# Patient Record
Sex: Male | Born: 1950 | Race: Black or African American | Hispanic: No | Smoking: Current every day smoker
Health system: Southern US, Community
[De-identification: ages and names within clinical notes are randomized; demographics above are authoritative.]

## PROBLEM LIST (undated history)

## (undated) DIAGNOSIS — K5730 Diverticulosis of large intestine without perforation or abscess without bleeding: Secondary | ICD-10-CM

## (undated) DIAGNOSIS — Z860101 Personal history of adenomatous and serrated colon polyps: Secondary | ICD-10-CM

## (undated) DIAGNOSIS — C61 Malignant neoplasm of prostate: Secondary | ICD-10-CM

## (undated) DIAGNOSIS — Z86010 Personal history of colonic polyps: Secondary | ICD-10-CM

## (undated) DIAGNOSIS — J449 Chronic obstructive pulmonary disease, unspecified: Secondary | ICD-10-CM

## (undated) DIAGNOSIS — Z973 Presence of spectacles and contact lenses: Secondary | ICD-10-CM

## (undated) DIAGNOSIS — J410 Simple chronic bronchitis: Secondary | ICD-10-CM

## (undated) DIAGNOSIS — I10 Essential (primary) hypertension: Secondary | ICD-10-CM

## (undated) DIAGNOSIS — K08109 Complete loss of teeth, unspecified cause, unspecified class: Secondary | ICD-10-CM

## (undated) HISTORY — DX: Essential (primary) hypertension: I10

## (undated) HISTORY — PX: COLONOSCOPY WITH PROPOFOL: SHX5780

## (undated) HISTORY — PX: PROSTATE BIOPSY: SHX241

---

## 2007-06-24 ENCOUNTER — Ambulatory Visit: Admission: RE | Admit: 2007-06-24 | Discharge: 2007-06-24 | Payer: Self-pay | Admitting: Family Medicine

## 2007-11-30 ENCOUNTER — Ambulatory Visit: Payer: Self-pay | Admitting: Vascular Surgery

## 2007-11-30 ENCOUNTER — Encounter (INDEPENDENT_AMBULATORY_CARE_PROVIDER_SITE_OTHER): Payer: Self-pay | Admitting: Family Medicine

## 2007-11-30 ENCOUNTER — Ambulatory Visit (HOSPITAL_COMMUNITY): Admission: RE | Admit: 2007-11-30 | Discharge: 2007-11-30 | Payer: Self-pay | Admitting: Family Medicine

## 2015-11-03 ENCOUNTER — Other Ambulatory Visit: Payer: Self-pay | Admitting: Family Medicine

## 2015-11-03 ENCOUNTER — Ambulatory Visit
Admission: RE | Admit: 2015-11-03 | Discharge: 2015-11-03 | Disposition: A | Discharge status: Home / Self Care | Payer: BC Managed Care – PPO | Source: Ambulatory Visit | Attending: Family Medicine | Admitting: Family Medicine

## 2015-11-03 DIAGNOSIS — R059 Cough, unspecified: Secondary | ICD-10-CM

## 2015-11-03 DIAGNOSIS — R05 Cough: Secondary | ICD-10-CM

## 2016-03-14 DIAGNOSIS — M79673 Pain in unspecified foot: Secondary | ICD-10-CM

## 2016-06-10 ENCOUNTER — Other Ambulatory Visit: Payer: Self-pay | Admitting: Family Medicine

## 2016-06-10 ENCOUNTER — Ambulatory Visit
Admission: RE | Admit: 2016-06-10 | Discharge: 2016-06-10 | Disposition: A | Discharge status: Home / Self Care | Payer: Medicare Other | Source: Ambulatory Visit | Attending: Family Medicine | Admitting: Family Medicine

## 2016-06-10 DIAGNOSIS — M25511 Pain in right shoulder: Secondary | ICD-10-CM

## 2016-12-11 ENCOUNTER — Other Ambulatory Visit: Payer: Self-pay | Admitting: Family Medicine

## 2016-12-11 ENCOUNTER — Ambulatory Visit
Admission: RE | Admit: 2016-12-11 | Discharge: 2016-12-11 | Disposition: A | Discharge status: Home / Self Care | Payer: Medicare Other | Source: Ambulatory Visit | Attending: Family Medicine | Admitting: Family Medicine

## 2016-12-11 DIAGNOSIS — R05 Cough: Secondary | ICD-10-CM

## 2016-12-11 DIAGNOSIS — R059 Cough, unspecified: Secondary | ICD-10-CM

## 2016-12-12 ENCOUNTER — Other Ambulatory Visit: Payer: Self-pay | Admitting: Family Medicine

## 2016-12-12 ENCOUNTER — Encounter: Payer: Self-pay | Admitting: Gastroenterology

## 2016-12-12 DIAGNOSIS — Z136 Encounter for screening for cardiovascular disorders: Secondary | ICD-10-CM

## 2016-12-23 ENCOUNTER — Ambulatory Visit
Admission: RE | Admit: 2016-12-23 | Discharge: 2016-12-23 | Disposition: A | Discharge status: Home / Self Care | Payer: Medicare Other | Source: Ambulatory Visit | Attending: Family Medicine | Admitting: Family Medicine

## 2016-12-23 DIAGNOSIS — Z136 Encounter for screening for cardiovascular disorders: Secondary | ICD-10-CM

## 2017-02-05 ENCOUNTER — Encounter: Payer: Self-pay | Admitting: Gastroenterology

## 2017-02-05 ENCOUNTER — Ambulatory Visit (AMBULATORY_SURGERY_CENTER): Payer: Self-pay | Admitting: *Deleted

## 2017-02-05 VITALS — Ht 69.0 in | Wt 219.8 lb

## 2017-02-05 DIAGNOSIS — Z1211 Encounter for screening for malignant neoplasm of colon: Secondary | ICD-10-CM

## 2017-02-05 MED ORDER — NA SULFATE-K SULFATE-MG SULF 17.5-3.13-1.6 GM/177ML PO SOLN: ORAL | 0 refills | Status: DC

## 2017-02-05 NOTE — Progress Notes (Signed)
No allergies to eggs or soy. No prior anesthesia.  Pt given Emmi instructions for colonoscopy  No oxygen use  No diet drug use  

## 2017-02-19 ENCOUNTER — Encounter: Payer: Self-pay | Admitting: Gastroenterology

## 2017-02-19 ENCOUNTER — Ambulatory Visit (AMBULATORY_SURGERY_CENTER): Payer: Medicare Other | Admitting: Gastroenterology

## 2017-02-19 VITALS — BP 133/82 | HR 67 | Temp 98.4°F | Resp 25 | Ht 69.0 in | Wt 219.0 lb

## 2017-02-19 DIAGNOSIS — D127 Benign neoplasm of rectosigmoid junction: Secondary | ICD-10-CM | POA: Diagnosis not present

## 2017-02-19 DIAGNOSIS — D123 Benign neoplasm of transverse colon: Secondary | ICD-10-CM | POA: Diagnosis not present

## 2017-02-19 DIAGNOSIS — Z1212 Encounter for screening for malignant neoplasm of rectum: Secondary | ICD-10-CM | POA: Diagnosis not present

## 2017-02-19 DIAGNOSIS — Z1211 Encounter for screening for malignant neoplasm of colon: Secondary | ICD-10-CM

## 2017-02-19 DIAGNOSIS — D124 Benign neoplasm of descending colon: Secondary | ICD-10-CM | POA: Diagnosis not present

## 2017-02-19 DIAGNOSIS — D120 Benign neoplasm of cecum: Secondary | ICD-10-CM | POA: Diagnosis not present

## 2017-02-19 DIAGNOSIS — D122 Benign neoplasm of ascending colon: Secondary | ICD-10-CM | POA: Diagnosis not present

## 2017-02-19 HISTORY — PX: COLONOSCOPY: SHX174

## 2017-02-19 MED ORDER — SODIUM CHLORIDE 0.9 % IV SOLN: 500.0000 mL | INTRAVENOUS | Status: DC

## 2017-02-19 NOTE — Patient Instructions (Signed)
YOU HAD AN ENDOSCOPIC PROCEDURE TODAY AT [facility]:   Refer to the procedure report that was given to you for any specific questions about what was found during the examination.  If the procedure report does not answer your questions, please call your gastroenterologist to clarify.  If you requested that your care partner not be given the details of your procedure findings, then the procedure report has been included in a sealed envelope for you to review at your convenience later.  YOU SHOULD EXPECT: Some feelings of bloating in the abdomen. Passage of more gas than usual.  Walking can help get rid of the air that was put into your GI tract during the procedure and reduce the bloating. If you had a lower endoscopy (such as a colonoscopy or flexible sigmoidoscopy) you may notice spotting of blood in your stool or on the toilet paper. If you underwent a bowel prep for your procedure, you may not have a normal bowel movement for a few days.  Please Note:  You might notice some irritation and congestion in your nose or some drainage.  This is from the oxygen used during your procedure.  There is no need for concern and it should clear up in a day or so.  SYMPTOMS TO REPORT IMMEDIATELY:   Following lower endoscopy (colonoscopy or flexible sigmoidoscopy):  Excessive amounts of blood in the stool  Significant tenderness or worsening of abdominal pains  Swelling of the abdomen that is new, acute  Fever of 100F or higher   Following upper endoscopy (EGD)  Vomiting of blood or coffee ground material  New chest pain or pain under the shoulder blades  Painful or persistently difficult swallowing  New shortness of breath  Fever of 100F or higher  Black, tarry-looking stools  For urgent or emergent issues, a gastroenterologist can be reached at any hour by calling [phone].   DIET:  We do recommend a small meal at first, but then you may proceed to your regular diet.  Drink  plenty of fluids but you should avoid alcoholic beverages for 24 hours.  ACTIVITY:  You should plan to take it easy for the rest of today and you should NOT DRIVE or use heavy machinery until tomorrow (because of the sedation medicines used during the test).    FOLLOW UP: Our staff will call the number listed on your records the next business day following your procedure to check on you and address any questions or concerns that you may have regarding the information given to you following your procedure. If we do not reach you, we will leave a message.  However, if you are feeling well and you are not experiencing any problems, there is no need to return our call.  We will assume that you have returned to your regular daily activities without incident.  If any biopsies were taken you will be contacted by phone or by letter within the next 1-3 weeks.  Please call us at [phone] if you have not heard about the biopsies in 3 weeks.    SIGNATURES/CONFIDENTIALITY: You and/or your care partner have signed paperwork which will be entered into your electronic medical record.  These signatures attest to the fact that that the information above on your After Visit Summary has been reviewed and is understood.  Full responsibility of the confidentiality of this discharge information lies with you and/or your care-partner.  Polyp, diverticulosis information given,   Ibuprofen, naproxen or other non -  steroidal anti inflammatory medications for 2 weeks.

## 2017-02-19 NOTE — Progress Notes (Signed)
Pt's states no medical or surgical changes since previsit or office visit. 

## 2017-02-19 NOTE — Progress Notes (Signed)
ALERT AND ORIENTED TIMES 3. PT PLEASED WITH MAC. REPORT TO Opal Sidles RN

## 2017-02-19 NOTE — Op Note (Addendum)
[facility] Patient Name: Alan Knapp Procedure Date: 02/19/2017 11:21 AM MRN: [id number] Endoscopist: Remo Lipps P. Jemma Rasp MD, MD Age: 66 Referring MD:  Date of Birth: 1951/05/07 Gender: Male Account #: [id number] Procedure:                Colonoscopy Indications:              Screening for colorectal malignant neoplasm, This                            is the patient's first colonoscopy Medicines:                Monitored Anesthesia Care Procedure:                Pre-Anesthesia Assessment:                           - Prior to the procedure, a History and Physical                            was performed, and patient medications and                            allergies were reviewed. The patient's tolerance of                            previous anesthesia was also reviewed. The risks                            and benefits of the procedure and the sedation                            options and risks were discussed with the patient.                            All questions were answered, and informed consent                            was obtained. Prior Anticoagulants: The patient has                            taken aspirin, last dose was 1 day prior to                            procedure. ASA Grade Assessment: II - A patient                            with mild systemic disease. After reviewing the                            risks and benefits, the patient was deemed in                            satisfactory condition to undergo the procedure.  After obtaining informed consent, the colonoscope                            was passed under direct vision. Throughout the                            procedure, the patient's blood pressure, pulse, and                            oxygen saturations were monitored continuously. The                            Colonoscope was introduced through the anus and                            advanced to the the  cecum, identified by                            appendiceal orifice and ileocecal valve. The                            colonoscopy was performed without difficulty. The                            patient tolerated the procedure well. The quality                            of the bowel preparation was good. The ileocecal                            valve, appendiceal orifice, and rectum were                            photographed. Scope In: 11:26:27 AM Scope Out: 11:55:56 AM Scope Withdrawal Time: 0 hours 25 minutes 29 seconds  Total Procedure Duration: 0 hours 29 minutes 29 seconds  Findings:                 The perianal and digital rectal examinations were                            normal.                           Two sessile polyps were found in the cecum. The                            polyps were 3 to 4 mm in size. These polyps were                            removed with a cold snare. Resection and retrieval                            were complete.  Two sessile polyps were found in the ascending                            colon. The polyps were 3 to 5 mm in size. These                            polyps were removed with a cold snare. Resection                            and retrieval were complete.                           A 7 mm polyp was found in the hepatic flexure. The                            polyp was sessile. The polyp was removed with a                            cold snare. Resection and retrieval were complete.                           Three sessile polyps were found in the descending                            colon. The polyps were 3 to 6 mm in size. These                            polyps were removed with a cold snare. Resection                            and retrieval were complete.                           A 5 mm polyp was found in the recto-sigmoid colon.                            The polyp was flat. The polyp was removed with a                             cold snare. Resection and retrieval were complete.                           Scattered medium-mouthed diverticula were found in                            the left colon and right colon.                           Anal papilla(e) were hypertrophied.                           The exam was otherwise normal throughout the  examined colon. Of note, there was a network outage                            during the procedure and retroflexed views, while                            obtained, photos were not stored. Complications:            No immediate complications. Estimated blood loss:                            Minimal. Estimated Blood Loss:     Estimated blood loss was minimal. Impression:               - Two 3 to 4 mm polyps in the cecum, removed with a                            cold snare. Resected and retrieved.                           - Two 3 to 5 mm polyps in the ascending colon,                            removed with a cold snare. Resected and retrieved.                           - One 7 mm polyp at the hepatic flexure, removed                            with a cold snare. Resected and retrieved.                           - Three 3 to 6 mm polyps in the descending colon,                            removed with a cold snare. Resected and retrieved.                           - One 5 mm polyp at the recto-sigmoid colon,                            removed with a cold snare. Resected and retrieved.                           - Diverticulosis in the left colon and in the right                            colon.                           - Anal papilla(e) were hypertrophied.                           - Retroflexed  views obtained but not stored due to                            network outage. Recommendation:           - Patient has a contact number available for                            emergencies. The signs and symptoms of potential                             delayed complications were discussed with the                            patient. Return to normal activities tomorrow.                            Written discharge instructions were provided to the                            patient.                           - Resume previous diet.                           - Continue present medications.                           - Await pathology results.                           - Repeat colonoscopy is recommended for                            surveillance. The colonoscopy date will be                            determined after pathology results from today's                            exam become available for review.                           - No ibuprofen, naproxen, or other non-steroidal                            anti-inflammatory drugs for 2 weeks after polyp                            removal. Remo Lipps P. Aaron Boeh MD, MD 02/19/2017 12:07:28 PM This report has been signed electronically.

## 2017-02-19 NOTE — Progress Notes (Signed)
Called to room to assist during endoscopic procedure.  Patient ID and intended procedure confirmed with present staff. Received instructions for my participation in the procedure from the performing physician.  

## 2017-02-20 ENCOUNTER — Telehealth: Payer: Self-pay

## 2017-02-20 NOTE — Telephone Encounter (Signed)
  Follow up Call-  Call back number 02/19/2017  Post procedure Call Back phone  # [phone]  Permission to leave phone message Yes  Some recent data might be hidden     Patient questions:  Do you have a fever, pain , or abdominal swelling? No. Pain Score  0 *  Have you tolerated food without any problems? Yes.    Have you been able to return to your normal activities? Yes.    Do you have any questions about your discharge instructions: Diet   No. Medications  No. Follow up visit  No.  Do you have questions or concerns about your Care? No.  Actions: * If pain score is 4 or above: No action needed, pain <4.

## 2017-02-24 ENCOUNTER — Encounter: Payer: Self-pay | Admitting: Gastroenterology

## 2019-01-26 ENCOUNTER — Ambulatory Visit
Admission: RE | Admit: 2019-01-26 | Discharge: 2019-01-26 | Disposition: A | Discharge status: Home / Self Care | Payer: Medicare Other | Source: Ambulatory Visit | Attending: Radiation Oncology | Admitting: Radiation Oncology

## 2019-01-26 ENCOUNTER — Encounter: Payer: Self-pay | Admitting: Radiation Oncology

## 2019-01-26 ENCOUNTER — Other Ambulatory Visit: Payer: Self-pay

## 2019-01-26 VITALS — Ht 69.0 in | Wt 225.0 lb

## 2019-01-26 DIAGNOSIS — C61 Malignant neoplasm of prostate: Secondary | ICD-10-CM

## 2019-01-26 HISTORY — DX: Malignant neoplasm of prostate: C61

## 2019-01-26 NOTE — Progress Notes (Signed)
See progress note under physician encounter. 

## 2019-01-26 NOTE — Progress Notes (Signed)
Radiation Oncology         [phone] ________________________________  Initial Outpatient Consultation - Conducted via WebEx due to current COVID-19 concerns for limiting patient exposure  Name: Alan Knapp MRN: [id number]  Date: 01/26/2019  DOB: March 08, 1951  YH:[id number], [name], MD  Franchot Gallo, MD   REFERRING PHYSICIAN: Franchot Gallo, MD  DIAGNOSIS: 68 y.o. gentleman with Stage T2a adenocarcinoma of the prostate with Gleason score of 3+4 and PSA of 4.05.    ICD-10-CM   1. Malignant neoplasm of prostate ([place]) C61     HISTORY OF PRESENT ILLNESS: Alan Knapp is a 68 y.o. male with a diagnosis of prostate cancer. He was initially noted to have an elevated PSA of 4.82 in April 2018 by his primary care physician, Dr. Antony Contras.  Accordingly, he was referred for evaluation in urology by Dr. Diona Fanti on 02/03/2017, and re-check PSA at that time lowered to 3.29. Subsequent PSA's were 4.63 in August 2019 and 4.05, most recently in February 2020. He returned for follow-up with Dr. Diona Fanti on 10/23/2018, where a digital rectal examination was performed at that time revealing a new 5 mm prostate nodule in the right mid gland.  The patient proceeded to transrectal ultrasound with 12 biopsies of the prostate on 12/16/2018.  The prostate volume measured 23.53 cc.  Out of 12 core biopsies, 6 were positive.  The maximum Gleason score was 3+4, and this was seen in the right base lateral and right mid lateral. Additionally, there was Gleason 3+3 disease seen in the left mid lateral, left base, right base, and right mid.  Biopsies of prostate revealed:    The patient reviewed the biopsy results with his urologist and he has kindly been referred today for discussion of potential radiation treatment options.   PREVIOUS RADIATION THERAPY: No  PAST MEDICAL HISTORY:  Past Medical History:  Diagnosis Date  . Hypertension   . Prostate cancer ([place])       PAST SURGICAL HISTORY: Past Surgical  History:  Procedure Laterality Date  . PROSTATE BIOPSY      FAMILY HISTORY:  Family History  Problem Relation Age of Onset  . Pancreatic cancer Father   . Pancreatic cancer Brother   . Colon cancer Neg Hx   . Prostate cancer Neg Hx   . Breast cancer Neg Hx     SOCIAL HISTORY:  Social History   Socioeconomic History  . Marital status: Divorced    Spouse name: Not on file  . Number of children: 2  . Years of education: Not on file  . Highest education level: Not on file  Occupational History    Comment: retired  Scientific laboratory technician  . Financial resource strain: Not on file  . Food insecurity:    Worry: Not on file    Inability: Not on file  . Transportation needs:    Medical: Not on file    Non-medical: Not on file  Tobacco Use  . Smoking status: Current Some Day Smoker    Types: Cigars  . Smokeless tobacco: Never Used  Substance and Sexual Activity  . Alcohol use: No  . Drug use: No  . Sexual activity: Not on file  Lifestyle  . Physical activity:    Days per week: Not on file    Minutes per session: Not on file  . Stress: Not on file  Relationships  . Social connections:    Talks on phone: Not on file    Gets together: Not on file  Attends religious service: Not on file    Active member of club or organization: Not on file    Attends meetings of clubs or organizations: Not on file    Relationship status: Not on file  . Intimate partner violence:    Fear of current or ex partner: Not on file    Emotionally abused: Not on file    Physically abused: Not on file    Forced sexual activity: Not on file  Other Topics Concern  . Not on file  Social History Narrative  . Not on file    ALLERGIES: Patient has no known allergies.  MEDICATIONS:  Current Outpatient Medications  Medication Sig Dispense Refill  . amLODipine (NORVASC) 10 MG tablet Take 10 mg by mouth daily.    Marland Kitchen aspirin 81 MG chewable tablet Chew 81 mg by mouth daily.     Current  Facility-Administered Medications  Medication Dose Route Frequency Provider Last Rate Last Dose  . 0.9 %  sodium chloride infusion  500 mL Intravenous Continuous Armbruster, Carlota Raspberry, MD        REVIEW OF SYSTEMS:  On review of systems, the patient reports that he is doing well overall. He denies any chest pain, shortness of breath, cough, fevers, chills, night sweats, unintended weight changes. He denies any bowel disturbances, and denies abdominal pain, nausea or vomiting. He denies any new musculoskeletal or joint aches or pains. His IPSS was 0, indicating no urinary symptoms. His SHIM was 1, indicating he does have severe erectile dysfunction. A complete review of systems is obtained and is otherwise negative.    PHYSICAL EXAM:  Wt Readings from Last 3 Encounters:  01/26/19 225 lb (102.1 kg)  02/19/17 219 lb (99.3 kg)  02/05/17 219 lb 12.8 oz (99.7 kg)   Temp Readings from Last 3 Encounters:  02/19/17 98.4 F (36.9 C)   BP Readings from Last 3 Encounters:  02/19/17 133/82   Pulse Readings from Last 3 Encounters:  02/19/17 67   Pain Assessment Pain Score: 0-No pain/10  In general this is a well appearing African-American male in no acute distress. He is alert and oriented x4 and appropriate throughout the examination. Cardiopulmonary assessment is negative for acute distress and he exhibits normal effort.  **Remainder of exam not performed in light of virtual [facility] consultation platform.**  KPS = 90  100 - Normal; no complaints; no evidence of disease. 90   - Able to carry on normal activity; minor signs or symptoms of disease. 80   - Normal activity with effort; some signs or symptoms of disease. 81   - Cares for self; unable to carry on normal activity or to do active work. 60   - Requires occasional assistance, but is able to care for most of his personal needs. 50   - Requires considerable assistance and frequent medical care. 44   - Disabled; requires special care and  assistance. 77   - Severely disabled; hospital admission is indicated although death not imminent. 57   - Very sick; hospital admission necessary; active supportive treatment necessary. 10   - Moribund; fatal processes progressing rapidly. 0     - Dead  Karnofsky DA, Abelmann WH, Craver LS and Burchenal JH [phone]) The use of the nitrogen mustards in the palliative treatment of carcinoma: with particular reference to bronchogenic carcinoma Cancer 1 634-56  LABORATORY DATA:  No results found for: WBC, HGB, HCT, MCV, PLT No results found for: NA, K, CL, CO2 No results found  for: ALT, AST, GGT, ALKPHOS, BILITOT   RADIOGRAPHY: No results found.    IMPRESSION/PLAN:  1. 68 y.o. gentleman with Stage T2a adenocarcinoma of the prostate with Gleason Score of 3+4, and PSA of 4.05. We discussed the patient's workup and outlined the nature of prostate cancer in this setting. The patient's Gleason's score puts him into the favorable-intermediate risk (FIR) group. Accordingly, he is eligible for a variety of potential treatment options including radical prostatectomy, prostate brachytherapy, or 5.5 weeks of external radiation. We discussed the available radiation techniques, and focused on the details and logistics and delivery. We discussed and outlined the risks, benefits, short and long-term effects associated with radiotherapy and compared and contrasted these with prostatectomy. We discussed the role of SpaceOAR in reducing the rectal toxicity associated with radiotherapy.  The patient and his daughter were encouraged to ask questions that were answered to their stated satisfaction.  At the end of the conversation the patient is interested in moving forward with brachytherapy and use of SpaceOAR to reduce rectal toxicity from radiotherapy.  He understands that the timing of starting his radiation treatment will depend on progress made in controlling the current COVID-19 pandemic which may delay the scheduling  of procedures and/or start of treatment as we move forward (currently anticipate late-July).  We will remain in close communication with the patient to coordinate his CT simulation prior to treatment.  The patient appears to have a good understanding of his disease and our recommendations and is comfortable and in agreement with the stated plan.  We will share this information with Dr. Diona Fanti and proceed with treatment planning accordingly.  Romie Jumper in our office will be working closely with him to coordinate OR scheduling and pre and post procedure appointments.  We will contact the pharmaceutical rep to ensure that [place] is available at the time of procedure.  He will have a prostate MRI following his post-seed CT SIM to confirm appropriate distribution of the [place]. Note: Primary contact number for the patient is his daughter, Amy: [phone].  This encounter was provided by telemedicine platform Webex.  The patient has given verbal consent for this type of encounter and has been advised to only accept a meeting of this type in a secure network environment. The time spent during this encounter was 45 minutes. The attendants for this meeting include Tyler Pita MD, Freeman Caldron PA-C, scribe Clinton Sawyer, patient, Dhaval Woo and his daughter, Amy. During the encounter, Tyler Pita MD, Ashlyn Bruning PA-C, and scribe Clinton Sawyer were located at [facility] Radiation Oncology Department.  Patient, Solace Wendorff and daughter, Amy were located at home.     Nicholos Johns, PA-C    Tyler Pita, MD  [facility] Direct Dial: [phone]  Fax: [phone] [email or web address]  Skype  [email or web address]  This document serves as a record of services personally performed by Tyler Pita, MD and Freeman Caldron, PA-C. It was created on their behalf by Rae Lips, a trained medical scribe. The creation of this record is based on the  scribe's personal observations and the providers' statements to them. This document has been checked and approved by the attending providers.

## 2019-01-26 NOTE — Progress Notes (Signed)
GU Location of Tumor / Histology: prostatic adenocarcinoma  If Prostate Cancer, Gleason Score is (3 + 4) and PSA is (4.05). Prostate volume: 23.53 mL    Biopsies of prostate (if applicable) revealed:    Past/Anticipated interventions by urology, if any: prostate biopsy, referral to Dr. Tammi Klippel for consideration of brachytherapy  Past/Anticipated interventions by medical oncology, if any: no  Weight changes, if any: no  Bowel/Bladder complaints, if any: IPSS 0. SHIM 1. Denies dysuria. Denies hematuria. Denies urinary leakage or incontinence.   Nausea/Vomiting, if any: no  Pain issues, if any:  no  SAFETY ISSUES:  Prior radiation? no  Pacemaker/ICD? no  Possible current pregnancy? no, male patient   Is the patient on methotrexate? no  Current Complaints / other details:  68 year old male. Divorced with 2 children.

## 2019-01-29 ENCOUNTER — Telehealth: Payer: Self-pay | Admitting: Medical Oncology

## 2019-01-29 ENCOUNTER — Encounter: Payer: Self-pay | Admitting: Medical Oncology

## 2019-01-29 NOTE — Telephone Encounter (Signed)
Spoke with Alan Knapp to introduce myself as the prostate nurse navigator and discuss my role. I was unable to meet him 5/12 when he consulted with Dr. Tammi Klippel due to COVID-19 restrictions. He states the visit went very well and he was pleased. He has decided on brachytherapy as treatment. He is scheduled for surgery 7/31. I informed him that Enid Derry will contact his daughter with pre-surgery testing appointments and planning. I asked him to call me with questions or concerns. He voiced understanding of the above.

## 2019-02-05 ENCOUNTER — Other Ambulatory Visit: Payer: Self-pay | Admitting: Urology

## 2019-02-16 ENCOUNTER — Telehealth: Payer: Self-pay | Admitting: *Deleted

## 2019-02-16 NOTE — Telephone Encounter (Signed)
CALLED PATIENT'S DAUGHTER- AMY TO INFORM OF PRE-SEED PLANNING CT ON 03-18-19 AND HIS CHEST X-RAY AND EKG AND HIS BLOOD WORK ON 04-09-19 @ [facility] ADMITTING AND HIS IMPLANT ON 04-16-19, SPOKE WITH PATIENT'S AMY AND SHE IS AWARE OF THESE APPTS. AND IMPLANT.

## 2019-02-23 ENCOUNTER — Other Ambulatory Visit: Payer: Self-pay | Admitting: Urology

## 2019-02-23 DIAGNOSIS — C61 Malignant neoplasm of prostate: Secondary | ICD-10-CM

## 2019-03-15 NOTE — Progress Notes (Signed)
  Radiation Oncology         [phone] ________________________________  Name: Alan Knapp MRN: [id number]  Date: 03/18/2019  DOB: 04-03-1951  SIMULATION AND TREATMENT PLANNING NOTE PUBIC ARCH STUDY  DV:[id number], Alan Brow, MD  Alan Contras, MD  DIAGNOSIS: 68 y.o. gentleman with Stage T2a adenocarcinoma of the prostate with Gleason score of 3+4 and PSA of 4.05     ICD-10-CM   1. Malignant neoplasm of prostate ([place])  C61     COMPLEX SIMULATION:  The patient presented today for evaluation for possible prostate seed implant. He was brought to the radiation planning suite and placed supine on the CT couch. A 3-dimensional image study set was obtained in upload to the planning computer. There, on each axial slice, I contoured the prostate gland. Then, using three-dimensional radiation planning tools I reconstructed the prostate in view of the structures from the transperineal needle pathway to assess for possible pubic arch interference. In doing so, I did not appreciate any pubic arch interference. Also, the patient's prostate volume was estimated based on the drawn structure. The volume was 24 cc.  By TRUS at biopsy, his volume 23.53 cc.  Given the pubic arch appearance and prostate volume, patient remains a good candidate to proceed with prostate seed implant. Today, he freely provided informed written consent to proceed.    PLAN: The patient will undergo prostate seed implant.   ________________________________  Sheral Apley. Tammi Klippel, M.D.

## 2019-03-17 ENCOUNTER — Telehealth: Payer: Self-pay | Admitting: *Deleted

## 2019-03-17 NOTE — Telephone Encounter (Signed)
CALLED PATIENT TO REMIND OF PRE-SEED APPTS.FOR 03-18-19, SPOKE WITH PATIENT AND HE IS AWARE OF THESE APPTS.

## 2019-03-18 ENCOUNTER — Other Ambulatory Visit: Payer: Self-pay

## 2019-03-18 ENCOUNTER — Encounter (HOSPITAL_COMMUNITY)
Admission: RE | Admit: 2019-03-18 | Discharge: 2019-03-18 | Disposition: A | Discharge status: Home / Self Care | Payer: Medicare Other | Source: Ambulatory Visit | Attending: Urology | Admitting: Urology

## 2019-03-18 ENCOUNTER — Ambulatory Visit
Admission: RE | Admit: 2019-03-18 | Discharge: 2019-03-18 | Disposition: A | Discharge status: Home / Self Care | Payer: Medicare Other | Source: Ambulatory Visit | Attending: Urology | Admitting: Urology

## 2019-03-18 ENCOUNTER — Ambulatory Visit (HOSPITAL_COMMUNITY)
Admission: RE | Admit: 2019-03-18 | Discharge: 2019-03-18 | Disposition: A | Discharge status: Home / Self Care | Payer: Medicare Other | Source: Ambulatory Visit | Attending: Urology | Admitting: Urology

## 2019-03-18 ENCOUNTER — Ambulatory Visit
Admission: RE | Admit: 2019-03-18 | Discharge: 2019-03-18 | Disposition: A | Discharge status: Home / Self Care | Payer: Medicare Other | Source: Ambulatory Visit | Attending: Radiation Oncology | Admitting: Radiation Oncology

## 2019-03-18 DIAGNOSIS — Z01818 Encounter for other preprocedural examination: Secondary | ICD-10-CM | POA: Diagnosis present

## 2019-03-18 DIAGNOSIS — C61 Malignant neoplasm of prostate: Secondary | ICD-10-CM | POA: Diagnosis present

## 2019-04-05 ENCOUNTER — Telehealth: Payer: Self-pay | Admitting: *Deleted

## 2019-04-05 NOTE — Telephone Encounter (Signed)
CALLED PATIENT TO INFORM THAT IMPLANT AND SPACE OAR HAS BEEN MOVED FROM 04-16-19 TO 05-06-19 @ 9:30 AM, SPOKE WITH PATIENT AND HE IS AWARE OF THIS CHANGE AND HE IS GOOD WITH IT.

## 2019-04-13 ENCOUNTER — Encounter (HOSPITAL_COMMUNITY): Payer: Medicare Other

## 2019-04-13 ENCOUNTER — Other Ambulatory Visit (HOSPITAL_COMMUNITY): Payer: Medicare Other

## 2019-04-30 ENCOUNTER — Telehealth: Payer: Self-pay | Admitting: *Deleted

## 2019-04-30 NOTE — Telephone Encounter (Signed)
CALLED PATIENT'S DAUGHTER TO REMIND OF LAB AND COVID 19 APPT. FOR 05-03-19, LVM FOR A RETURN CALL

## 2019-05-03 ENCOUNTER — Other Ambulatory Visit: Payer: Self-pay

## 2019-05-03 ENCOUNTER — Encounter (HOSPITAL_COMMUNITY)
Admission: RE | Admit: 2019-05-03 | Discharge: 2019-05-03 | Disposition: A | Discharge status: Home / Self Care | Payer: Medicare Other | Source: Ambulatory Visit | Attending: Urology | Admitting: Urology

## 2019-05-03 ENCOUNTER — Other Ambulatory Visit (HOSPITAL_COMMUNITY)
Admission: RE | Admit: 2019-05-03 | Discharge: 2019-05-03 | Disposition: A | Discharge status: Home / Self Care | Payer: Medicare Other | Source: Ambulatory Visit | Attending: Urology | Admitting: Urology

## 2019-05-03 DIAGNOSIS — Z800 Family history of malignant neoplasm of digestive organs: Secondary | ICD-10-CM | POA: Diagnosis not present

## 2019-05-03 DIAGNOSIS — C61 Malignant neoplasm of prostate: Secondary | ICD-10-CM | POA: Diagnosis not present

## 2019-05-03 DIAGNOSIS — J449 Chronic obstructive pulmonary disease, unspecified: Secondary | ICD-10-CM | POA: Diagnosis not present

## 2019-05-03 DIAGNOSIS — Z20828 Contact with and (suspected) exposure to other viral communicable diseases: Secondary | ICD-10-CM | POA: Diagnosis not present

## 2019-05-03 DIAGNOSIS — I10 Essential (primary) hypertension: Secondary | ICD-10-CM | POA: Diagnosis not present

## 2019-05-03 DIAGNOSIS — Z86010 Personal history of colonic polyps: Secondary | ICD-10-CM | POA: Diagnosis not present

## 2019-05-03 DIAGNOSIS — F17210 Nicotine dependence, cigarettes, uncomplicated: Secondary | ICD-10-CM | POA: Diagnosis not present

## 2019-05-03 DIAGNOSIS — Z01812 Encounter for preprocedural laboratory examination: Secondary | ICD-10-CM | POA: Insufficient documentation

## 2019-05-03 LAB — CBC
HCT: 47.3 % (ref 39.0–52.0)
Hemoglobin: 15.9 g/dL (ref 13.0–17.0)
MCH: 29.6 pg (ref 26.0–34.0)
MCHC: 33.6 g/dL (ref 30.0–36.0)
MCV: 87.9 fL (ref 80.0–100.0)
Platelets: 217 10*3/uL (ref 150–400)
RBC: 5.38 MIL/uL (ref 4.22–5.81)
RDW: 15.5 % (ref 11.5–15.5)
WBC: 7.8 10*3/uL (ref 4.0–10.5)
nRBC: 0 % (ref 0.0–0.2)

## 2019-05-03 LAB — COMPREHENSIVE METABOLIC PANEL
ALT: 15 U/L (ref 0–44)
AST: 25 U/L (ref 15–41)
Albumin: 4 g/dL (ref 3.5–5.0)
Alkaline Phosphatase: 36 U/L — ABNORMAL LOW (ref 38–126)
Anion gap: 8 (ref 5–15)
BUN: 13 mg/dL (ref 8–23)
CO2: 28 mmol/L (ref 22–32)
Calcium: 9.6 mg/dL (ref 8.9–10.3)
Chloride: 103 mmol/L (ref 98–111)
Creatinine, Ser: 1.35 mg/dL — ABNORMAL HIGH (ref 0.61–1.24)
GFR calc Af Amer: >60 mL/min (ref >60)
GFR calc non Af Amer: 54 mL/min — ABNORMAL LOW (ref >60)
Glucose, Bld: 97 mg/dL (ref 70–99)
Potassium: 4.4 mmol/L (ref 3.5–5.1)
Sodium: 139 mmol/L (ref 135–145)
Total Bilirubin: 1 mg/dL (ref 0.3–1.2)
Total Protein: 7.6 g/dL (ref 6.5–8.1)

## 2019-05-03 LAB — APTT: aPTT: 32 seconds (ref 24–36)

## 2019-05-03 LAB — SARS CORONAVIRUS 2 (TAT 6-24 HRS): SARS Coronavirus 2: NEGATIVE

## 2019-05-03 LAB — PROTIME-INR
INR: 1 (ref 0.8–1.2)
Prothrombin Time: 12.9 seconds (ref 11.4–15.2)

## 2019-05-04 ENCOUNTER — Encounter (HOSPITAL_BASED_OUTPATIENT_CLINIC_OR_DEPARTMENT_OTHER): Payer: Self-pay | Admitting: *Deleted

## 2019-05-04 ENCOUNTER — Encounter: Payer: Self-pay | Admitting: *Deleted

## 2019-05-04 ENCOUNTER — Other Ambulatory Visit: Payer: Self-pay

## 2019-05-04 NOTE — Progress Notes (Signed)
Spoke w/ pt's daughter, amy, via phone for pre-op interview.  Daughter verbalized understanding for pt to be npo after mn and arrive at 0730.  Current lab results dated 05-03-2019 in chart and epic (cbc, cmp, pt/ptt).  Current ekg/ cxr in chart and epic.  Pt to do one fleet enema am dos, daughter verbalized understanding.  Daughter stated her brother will drop pt off and she will be picking up at discharge.

## 2019-05-05 ENCOUNTER — Telehealth: Payer: Self-pay | Admitting: *Deleted

## 2019-05-05 NOTE — Telephone Encounter (Signed)
Called patient to remind of procedure for 05-06-19, spoke with patient and he is aware of this procedure

## 2019-05-06 ENCOUNTER — Encounter: Payer: Self-pay | Admitting: Medical Oncology

## 2019-05-06 ENCOUNTER — Encounter (HOSPITAL_BASED_OUTPATIENT_CLINIC_OR_DEPARTMENT_OTHER)
Admission: RE | Disposition: A | Discharge status: Home / Self Care | Payer: Self-pay | Source: Other Acute Inpatient Hospital | Attending: Urology

## 2019-05-06 ENCOUNTER — Ambulatory Visit (HOSPITAL_BASED_OUTPATIENT_CLINIC_OR_DEPARTMENT_OTHER): Payer: Medicare Other | Admitting: Anesthesiology

## 2019-05-06 ENCOUNTER — Ambulatory Visit (HOSPITAL_COMMUNITY): Payer: Medicare Other

## 2019-05-06 ENCOUNTER — Encounter (HOSPITAL_BASED_OUTPATIENT_CLINIC_OR_DEPARTMENT_OTHER): Payer: Self-pay

## 2019-05-06 ENCOUNTER — Ambulatory Visit (HOSPITAL_BASED_OUTPATIENT_CLINIC_OR_DEPARTMENT_OTHER): Payer: Medicare Other | Admitting: Physician Assistant

## 2019-05-06 ENCOUNTER — Ambulatory Visit (HOSPITAL_BASED_OUTPATIENT_CLINIC_OR_DEPARTMENT_OTHER)
Admission: RE | Admit: 2019-05-06 | Discharge: 2019-05-06 | Disposition: A | Discharge status: Home / Self Care | Payer: Medicare Other | Source: Other Acute Inpatient Hospital | Attending: Urology | Admitting: Urology

## 2019-05-06 DIAGNOSIS — Z01818 Encounter for other preprocedural examination: Secondary | ICD-10-CM

## 2019-05-06 DIAGNOSIS — C61 Malignant neoplasm of prostate: Secondary | ICD-10-CM | POA: Insufficient documentation

## 2019-05-06 DIAGNOSIS — Z86010 Personal history of colonic polyps: Secondary | ICD-10-CM | POA: Insufficient documentation

## 2019-05-06 DIAGNOSIS — Z20828 Contact with and (suspected) exposure to other viral communicable diseases: Secondary | ICD-10-CM | POA: Diagnosis not present

## 2019-05-06 DIAGNOSIS — F17210 Nicotine dependence, cigarettes, uncomplicated: Secondary | ICD-10-CM | POA: Insufficient documentation

## 2019-05-06 DIAGNOSIS — J449 Chronic obstructive pulmonary disease, unspecified: Secondary | ICD-10-CM | POA: Insufficient documentation

## 2019-05-06 DIAGNOSIS — Z800 Family history of malignant neoplasm of digestive organs: Secondary | ICD-10-CM | POA: Insufficient documentation

## 2019-05-06 DIAGNOSIS — I10 Essential (primary) hypertension: Secondary | ICD-10-CM | POA: Insufficient documentation

## 2019-05-06 HISTORY — DX: Chronic obstructive pulmonary disease, unspecified: J44.9

## 2019-05-06 HISTORY — PX: SPACE OAR INSTILLATION: SHX6769

## 2019-05-06 HISTORY — DX: Diverticulosis of large intestine without perforation or abscess without bleeding: K57.30

## 2019-05-06 HISTORY — DX: Presence of spectacles and contact lenses: Z97.3

## 2019-05-06 HISTORY — DX: Complete loss of teeth, unspecified cause, unspecified class: K08.109

## 2019-05-06 HISTORY — DX: Personal history of colonic polyps: Z86.010

## 2019-05-06 HISTORY — PX: RADIOACTIVE SEED IMPLANT: SHX5150

## 2019-05-06 HISTORY — DX: Personal history of adenomatous and serrated colon polyps: Z86.0101

## 2019-05-06 HISTORY — DX: Simple chronic bronchitis: J41.0

## 2019-05-06 SURGERY — INSERTION, RADIATION SOURCE, PROSTATE
Anesthesia: General | Site: Prostate

## 2019-05-06 MED ORDER — LIDOCAINE 2% (20 MG/ML) 5 ML SYRINGE
INTRAMUSCULAR | Status: AC
  Filled 2019-05-06: qty 5

## 2019-05-06 MED ORDER — PROPOFOL 10 MG/ML IV BOLUS
INTRAVENOUS | Status: AC
  Filled 2019-05-06: qty 40

## 2019-05-06 MED ORDER — FENTANYL CITRATE (PF) 100 MCG/2ML IJ SOLN
INTRAMUSCULAR | Status: AC
  Filled 2019-05-06: qty 2

## 2019-05-06 MED ORDER — SODIUM CHLORIDE (PF) 0.9 % IJ SOLN
INTRAMUSCULAR | Status: DC | PRN
  Administered 2019-05-06: 3 mL

## 2019-05-06 MED ORDER — DEXAMETHASONE SODIUM PHOSPHATE 10 MG/ML IJ SOLN
INTRAMUSCULAR | Status: AC
  Filled 2019-05-06: qty 1

## 2019-05-06 MED ORDER — PHENYLEPHRINE 40 MCG/ML (10ML) SYRINGE FOR IV PUSH (FOR BLOOD PRESSURE SUPPORT)
PREFILLED_SYRINGE | INTRAVENOUS | Status: DC | PRN
  Administered 2019-05-06: 120 ug via INTRAVENOUS
  Administered 2019-05-06: 80 ug via INTRAVENOUS
  Administered 2019-05-06: 120 ug via INTRAVENOUS
  Administered 2019-05-06: 80 ug via INTRAVENOUS

## 2019-05-06 MED ORDER — IOHEXOL 300 MG/ML  SOLN
INTRAMUSCULAR | Status: DC | PRN
  Administered 2019-05-06: 7 mL

## 2019-05-06 MED ORDER — ONDANSETRON HCL 4 MG/2ML IJ SOLN
INTRAMUSCULAR | Status: DC | PRN
  Administered 2019-05-06: 4 mg via INTRAVENOUS

## 2019-05-06 MED ORDER — FLEET ENEMA 7-19 GM/118ML RE ENEM
1.0000 | ENEMA | Freq: Once | RECTAL | Status: DC
  Filled 2019-05-06: qty 1

## 2019-05-06 MED ORDER — ACETAMINOPHEN 500 MG PO TABS
ORAL_TABLET | ORAL | Status: AC
  Filled 2019-05-06: qty 2

## 2019-05-06 MED ORDER — PHENYLEPHRINE 40 MCG/ML (10ML) SYRINGE FOR IV PUSH (FOR BLOOD PRESSURE SUPPORT)
PREFILLED_SYRINGE | INTRAVENOUS | Status: AC
  Filled 2019-05-06: qty 10

## 2019-05-06 MED ORDER — DEXAMETHASONE SODIUM PHOSPHATE 4 MG/ML IJ SOLN
INTRAMUSCULAR | Status: DC | PRN
  Administered 2019-05-06: 4 mg via INTRAVENOUS

## 2019-05-06 MED ORDER — SODIUM CHLORIDE 0.9 % IV SOLN
INTRAVENOUS | Status: AC | PRN
  Administered 2019-05-06: 1000 mL

## 2019-05-06 MED ORDER — CEFAZOLIN SODIUM-DEXTROSE 2-4 GM/100ML-% IV SOLN
2.0000 g | Freq: Once | INTRAVENOUS | Status: AC
  Administered 2019-05-06: 10:00:00 2 g via INTRAVENOUS
  Filled 2019-05-06: qty 100

## 2019-05-06 MED ORDER — ONDANSETRON HCL 4 MG/2ML IJ SOLN
INTRAMUSCULAR | Status: AC
  Filled 2019-05-06: qty 2

## 2019-05-06 MED ORDER — ACETAMINOPHEN 500 MG PO TABS
1000.0000 mg | ORAL_TABLET | Freq: Once | ORAL | Status: AC
  Administered 2019-05-06: 08:00:00 1000 mg via ORAL
  Filled 2019-05-06: qty 2

## 2019-05-06 MED ORDER — SODIUM CHLORIDE 0.9 % IV SOLN
INTRAVENOUS | Status: DC | PRN
  Administered 2019-05-06: 50 ug/min via INTRAVENOUS

## 2019-05-06 MED ORDER — PROMETHAZINE HCL 25 MG/ML IJ SOLN
6.2500 mg | INTRAMUSCULAR | Status: DC | PRN
  Filled 2019-05-06: qty 1

## 2019-05-06 MED ORDER — MIDAZOLAM HCL 2 MG/2ML IJ SOLN
INTRAMUSCULAR | Status: AC
  Filled 2019-05-06: qty 2

## 2019-05-06 MED ORDER — FENTANYL CITRATE (PF) 100 MCG/2ML IJ SOLN
25.0000 ug | INTRAMUSCULAR | Status: DC | PRN
  Filled 2019-05-06: qty 1

## 2019-05-06 MED ORDER — MIDAZOLAM HCL 5 MG/5ML IJ SOLN
INTRAMUSCULAR | Status: DC | PRN
  Administered 2019-05-06: 1 mg via INTRAVENOUS

## 2019-05-06 MED ORDER — MEPERIDINE HCL 25 MG/ML IJ SOLN
6.2500 mg | INTRAMUSCULAR | Status: DC | PRN
  Filled 2019-05-06: qty 1

## 2019-05-06 MED ORDER — PROPOFOL 10 MG/ML IV BOLUS
INTRAVENOUS | Status: DC | PRN
  Administered 2019-05-06: 200 mg via INTRAVENOUS
  Administered 2019-05-06: 20 mg via INTRAVENOUS

## 2019-05-06 MED ORDER — LACTATED RINGERS IV SOLN
INTRAVENOUS | Status: DC
  Administered 2019-05-06: 08:00:00 via INTRAVENOUS
  Filled 2019-05-06: qty 1000

## 2019-05-06 MED ORDER — MIDAZOLAM HCL 2 MG/2ML IJ SOLN
0.5000 mg | Freq: Once | INTRAMUSCULAR | Status: DC | PRN
  Filled 2019-05-06: qty 2

## 2019-05-06 MED ORDER — CEFAZOLIN SODIUM-DEXTROSE 2-4 GM/100ML-% IV SOLN
INTRAVENOUS | Status: AC
  Filled 2019-05-06: qty 100

## 2019-05-06 MED ORDER — EPHEDRINE 5 MG/ML INJ
INTRAVENOUS | Status: AC
  Filled 2019-05-06: qty 10

## 2019-05-06 MED ORDER — FENTANYL CITRATE (PF) 100 MCG/2ML IJ SOLN
INTRAMUSCULAR | Status: DC | PRN
  Administered 2019-05-06: 50 ug via INTRAVENOUS
  Administered 2019-05-06: 25 ug via INTRAVENOUS

## 2019-05-06 MED ORDER — LIDOCAINE 2% (20 MG/ML) 5 ML SYRINGE
INTRAMUSCULAR | Status: DC | PRN
  Administered 2019-05-06: 60 mg via INTRAVENOUS

## 2019-05-06 MED ORDER — PHENYLEPHRINE HCL (PRESSORS) 10 MG/ML IV SOLN
INTRAVENOUS | Status: AC
  Filled 2019-05-06: qty 1

## 2019-05-06 MED ORDER — EPHEDRINE SULFATE-NACL 50-0.9 MG/10ML-% IV SOSY
PREFILLED_SYRINGE | INTRAVENOUS | Status: DC | PRN
  Administered 2019-05-06: 10 mg via INTRAVENOUS

## 2019-05-06 SURGICAL SUPPLY — 47 items
BAG URINE DRAINAGE (UROLOGICAL SUPPLIES) ×4 IMPLANT
BLADE CLIPPER SENSICLIP SURGIC (BLADE) ×4 IMPLANT
CATH FOLEY 2WAY SLVR  5CC 16FR (CATHETERS) ×2
CATH FOLEY 2WAY SLVR 5CC 16FR (CATHETERS) ×2 IMPLANT
CATH ROBINSON RED A/P 16FR (CATHETERS) IMPLANT
CATH ROBINSON RED A/P 20FR (CATHETERS) ×4 IMPLANT
CLOTH BEACON ORANGE TIMEOUT ST (SAFETY) ×4 IMPLANT
CONT SPECI 4OZ STER CLIK (MISCELLANEOUS) ×8 IMPLANT
COVER BACK TABLE 60X90IN (DRAPES) ×4 IMPLANT
COVER MAYO STAND STRL (DRAPES) ×4 IMPLANT
COVER WAND RF STERILE (DRAPES) ×2 IMPLANT
DRSG TEGADERM 4X4.75 (GAUZE/BANDAGES/DRESSINGS) ×6 IMPLANT
DRSG TEGADERM 8X12 (GAUZE/BANDAGES/DRESSINGS) ×6 IMPLANT
GAUZE SPONGE 4X4 12PLY STRL (GAUZE/BANDAGES/DRESSINGS) ×2 IMPLANT
GLOVE BIO SURGEON STRL SZ 6 (GLOVE) IMPLANT
GLOVE BIO SURGEON STRL SZ 6.5 (GLOVE) IMPLANT
GLOVE BIO SURGEON STRL SZ7 (GLOVE) IMPLANT
GLOVE BIO SURGEON STRL SZ7.5 (GLOVE) ×2 IMPLANT
GLOVE BIO SURGEON STRL SZ8 (GLOVE) ×8 IMPLANT
GLOVE BIO SURGEONS STRL SZ 6.5 (GLOVE)
GLOVE BIOGEL PI IND STRL 6 (GLOVE) IMPLANT
GLOVE BIOGEL PI IND STRL 6.5 (GLOVE) IMPLANT
GLOVE BIOGEL PI IND STRL 7.0 (GLOVE) IMPLANT
GLOVE BIOGEL PI IND STRL 7.5 (GLOVE) IMPLANT
GLOVE BIOGEL PI IND STRL 8 (GLOVE) IMPLANT
GLOVE BIOGEL PI INDICATOR 6 (GLOVE)
GLOVE BIOGEL PI INDICATOR 6.5 (GLOVE)
GLOVE BIOGEL PI INDICATOR 7.0 (GLOVE)
GLOVE BIOGEL PI INDICATOR 7.5 (GLOVE) ×2
GLOVE BIOGEL PI INDICATOR 8 (GLOVE)
GLOVE SURG ORTHO 8.5 STRL (GLOVE) ×4 IMPLANT
GOWN STRL REUS W/ TWL LRG LVL3 (GOWN DISPOSABLE) IMPLANT
GOWN STRL REUS W/TWL LRG LVL3 (GOWN DISPOSABLE) ×4
GOWN STRL REUS W/TWL XL LVL3 (GOWN DISPOSABLE) ×6 IMPLANT
HOLDER FOLEY CATH W/STRAP (MISCELLANEOUS) ×4 IMPLANT
I-Seed AgX100 ×162 IMPLANT
IMPL SPACEOAR SYSTEM 10ML (Spacer) ×2 IMPLANT
IMPLANT SPACEOAR SYSTEM 10ML (Spacer) ×4 IMPLANT
IV NS 1000ML (IV SOLUTION) ×2
IV NS 1000ML BAXH (IV SOLUTION) ×2 IMPLANT
KIT TURNOVER CYSTO (KITS) ×4 IMPLANT
MARKER SKIN DUAL TIP RULER LAB (MISCELLANEOUS) ×4 IMPLANT
PACK CYSTO (CUSTOM PROCEDURE TRAY) ×4 IMPLANT
SUT BONE WAX W31G (SUTURE) IMPLANT
SYR 10ML LL (SYRINGE) ×4 IMPLANT
UNDERPAD 30X30 (UNDERPADS AND DIAPERS) ×8 IMPLANT
WATER STERILE IRR 500ML POUR (IV SOLUTION) ×4 IMPLANT

## 2019-05-06 NOTE — Anesthesia Preprocedure Evaluation (Addendum)
Anesthesia Evaluation  Patient identified by MRN, date of birth, ID band Patient awake    Reviewed: Allergy & Precautions, NPO status , Patient's Chart, lab work & pertinent test results  History of Anesthesia Complications Negative for: history of anesthetic complications  Airway Mallampati: I  TM Distance: >3 FB Neck ROM: Full    Dental  (+) Edentulous Upper, Edentulous Lower   Pulmonary COPD, Current Smoker,  05/03/2019 SARS coronavirus NEG   breath sounds clear to auscultation       Cardiovascular hypertension, Pt. on medications (-) angina Rhythm:Regular Rate:Normal     Neuro/Psych negative neurological ROS     GI/Hepatic negative GI ROS, Neg liver ROS,   Endo/Other  negative endocrine ROS  Renal/GU Renal disease (creat 1.35)     Musculoskeletal   Abdominal   Peds  Hematology negative hematology ROS (+)   Anesthesia Other Findings   Reproductive/Obstetrics                            Anesthesia Physical Anesthesia Plan  ASA: II  Anesthesia Plan: General   Post-op Pain Management:    Induction: Intravenous  PONV Risk Score and Plan: 1 and Ondansetron and Dexamethasone  Airway Management Planned: LMA  Additional Equipment:   Intra-op Plan:   Post-operative Plan:   Informed Consent: I have reviewed the patients History and Physical, chart, labs and discussed the procedure including the risks, benefits and alternatives for the proposed anesthesia with the patient or authorized representative who has indicated his/her understanding and acceptance.     Dental advisory given  Plan Discussed with: CRNA and Surgeon  Anesthesia Plan Comments:         Anesthesia Quick Evaluation

## 2019-05-06 NOTE — Discharge Instructions (Signed)
°  Post Anesthesia Home Care Instructions ° °Activity: °Get plenty of rest for the remainder of the day. A responsible individual must stay with you for 24 hours following the procedure.  °For the next 24 hours, DO NOT: °-Drive a car °-Operate machinery °-Drink alcoholic beverages °-Take any medication unless instructed by your physician °-Make any legal decisions or sign important papers. ° °Meals: °Start with liquid foods such as gelatin or soup. Progress to regular foods as tolerated. Avoid greasy, spicy, heavy foods. If nausea and/or vomiting occur, drink only clear liquids until the nausea and/or vomiting subsides. Call your physician if vomiting continues. ° °Special Instructions/Symptoms: °Your throat may feel dry or sore from the anesthesia or the breathing tube placed in your throat during surgery. If this causes discomfort, gargle with warm salt water. The discomfort should disappear within 24 hours. ° °If you had a scopolamine patch placed behind your ear for the management of post- operative nausea and/or vomiting: ° °1. The medication in the patch is effective for 72 hours, after which it should be removed.  Wrap patch in a tissue and discard in the trash. Wash hands thoroughly with soap and water. °2. You may remove the patch earlier than 72 hours if you experience unpleasant side effects which may include dry mouth, dizziness or visual disturbances. °3. Avoid touching the patch. Wash your hands with soap and water after contact with the patch. °  ° ° ° ° °Radioactive Seed Implant Home Care Instructions ° ° °Activity:    Rest for the remainder of the day.  Do not drive or operate equipment today.  You may resume normal  activities in a few days as instructed by your physician, without risk of harmful radiation exposure to those around you, provided you follow the time and distance precautions on the Radiation Oncology Instruction Sheet. ° ° °Meals: Drink plenty of lipuids and eat light foods, such as  gelatin or soup this evening .  You may return to normal meal plan tomorrow. ° °Return °To Work: You may return to work as instructed by your physician. ° °Special °Instruction:   If any seeds are found, use tweezers to pick up seeds and place in a glass container of any kind and bring to your physician's office. ° °Call your physician if any of these symptoms occur: ° °· Persistent or heavy bleeding °· Urine stream diminishes or stops completely after catheter is removed °· Fever equal to or greater than 101 degrees F °· Cloudy urine with a strong foul odor °· Severe pain ° °You may feel some burning pain and/or hesitancy when you urinate after the catheter is removed.  These symptoms may increase over the next few weeks, but should diminish within forur to six weeks.  Applying moist heat to the lower abdomen or a hot tub bath may help relieve the pain.  If the discomfort becomes severe, please call your physician for additional medications. ° °

## 2019-05-06 NOTE — Anesthesia Procedure Notes (Signed)
Procedure Name: LMA Insertion Date/Time: 05/06/2019 9:45 AM Performed by: Annye Asa, MD Pre-anesthesia Checklist: Patient identified, Emergency Drugs available, Suction available and Patient being monitored Patient Re-evaluated:Patient Re-evaluated prior to induction Oxygen Delivery Method: Circle system utilized Preoxygenation: Pre-oxygenation with 100% oxygen Induction Type: IV induction Ventilation: Mask ventilation without difficulty LMA: LMA inserted LMA Size: 5.0 Number of attempts: 1 Airway Equipment and Method: Bite block Placement Confirmation: positive ETCO2 Tube secured with: Tape Dental Injury: Teeth and Oropharynx as per pre-operative assessment

## 2019-05-06 NOTE — Op Note (Signed)
Preoperative diagnosis: Clinical stage TI C adenocarcinoma the prostate   Postoperative diagnosis: Same   Procedure: I-125 prostate seed implantation, flexible cystoscopy  Surgeon: Lillette Boxer. Srikar Chiang M.D.  Radiation Oncologist: Tyler Pita, M.D.  Anesthesia: Gen.   Indications: Patient  was diagnosed with clinical stage TI C prostate cancer. We had extensive discussion with him about treatment options versus. He elected to proceed with seed implantation. He underwent consultation my office as well as with Dr. Tammi Klippel. He appeared to understand the advantages disadvantages potential risks of this treatment option. Full informed consent has been obtained.   Technique and findings: Patient was brought the operating room where he had successful induction of general anesthesia. He was placed in dorso-lithotomy position and prepped and draped in usual manner. Appropriate surgical timeout was performed. Radiation oncology department placed a transrectal ultrasound probe anchoring stand. Foley catheter with contrast in the balloon was inserted without difficulty. Anchoring needles were placed within the prostate. Rectal tube was placed. Real-time contouring of the urethra prostate and rectum were performed and the dosing parameters were established. Targeted dose was 145 gray.  I was then called  to the operating suite suite for placement of the needles. A second timeout was performed. All needle passage was done with real-time transrectal ultrasound guidance with the sagittal plane. A total of 28 needles were placed.  81 active seeds were implanted.  I then proceeded with placement of SpaceOAR by introducing a needle with the bevel angled inferiorly approximately 2 cm superior to the anus. This was angled downward and under direct ultrasound was placed within the space between the prostatic capsule and rectum. This was confirmed with a small amount of sterile saline injected and this was performed  under direct ultrasound. I then attached the SpaceOAR to the needle and injected this in the space between the prostate and rectum with good placement noted. The Foley catheter was removed and flexible cystoscopy failed to show any seeds outside the prostate.  The patient was brought to recovery room in stable condition, having tolerated the procedure well.Marland Kitchen

## 2019-05-06 NOTE — Anesthesia Postprocedure Evaluation (Signed)
Anesthesia Post Note  Patient: Alan Knapp  Procedure(s) Performed: RADIOACTIVE SEED IMPLANT/BRACHYTHERAPY IMPLANT (N/A Prostate) SPACE OAR INSTILLATION (N/A )     Patient location during evaluation: Phase II Anesthesia Type: General Level of consciousness: awake and alert, oriented and patient cooperative Pain management: pain level controlled Vital Signs Assessment: post-procedure vital signs reviewed and stable Respiratory status: spontaneous breathing, nonlabored ventilation and respiratory function stable Cardiovascular status: blood pressure returned to baseline and stable Postop Assessment: no apparent nausea or vomiting and adequate PO intake Anesthetic complications: no    Last Vitals:  Vitals:   05/06/19 1145 05/06/19 1202  BP: 128/83 140/86  Pulse: 68   Resp: 18 18  Temp:  36.5 C  SpO2: 99% 98%    Last Pain:  Vitals:   05/06/19 1202  TempSrc:   PainSc: 0-No pain                 Kentavius Dettore,E. Niko Jakel

## 2019-05-06 NOTE — H&P (Signed)
H&P  Chief Complaint: Prostate cancer  History of Present Illness: 68 year old male presenting for I-125 brachytherapy for primary management of prostate cancer.  History as below:  TRUS/BX performed on 4.1.2020. At that time, PSA 4.05 (5 mm right prostatic nodule). Prostatic volume 23.53 mL. PSA density 0.17. 6/12 cores revealed adenocarcinoma:  For revealed GS 3+3 pattern  2 revealed GS 3+4 pattern (both of these on the right, right base lateral, right mid lateral, both with 90% of core involved with perineural invasion in 1.     Past Medical History:  Diagnosis Date  . COPD (chronic obstructive pulmonary disease) ([place])   . Diverticulosis of colon   . Full dentures   . History of adenomatous polyp of colon   . Hypertension   . Prostate cancer [facility]) urologist-- dr Ameya Kutz/  oncologsit-- dr Tammi Klippel   dx 12-16-2018--- Stage T2a,  Gleason 3+4  . Smokers' cough ([place])   . Wears glasses     Past Surgical History:  Procedure Laterality Date  . COLONOSCOPY WITH PROPOFOL  last one 02-19-2017  dr Luvenia Starch  . PROSTATE BIOPSY  12-16-2018  dr Diona Fanti office    Home Medications:    Allergies: No Known Allergies  Family History  Problem Relation Age of Onset  . Pancreatic cancer Father   . Pancreatic cancer Brother   . Colon cancer Neg Hx   . Prostate cancer Neg Hx   . Breast cancer Neg Hx     Social History:  reports that he has been smoking cigarettes. He has smoked for the past 50.00 years. He has never used smokeless tobacco. He reports that he does not drink alcohol or use drugs.  ROS: A complete review of systems was performed.  All systems are negative except for pertinent findings as noted.  Physical Exam:  Vital signs in last 24 hours: Temp:  [97.9 F (36.6 C)] 97.9 F (36.6 C) (08/20 0712) Pulse Rate:  [70] 70 (08/20 0712) Resp:  [16] 16 (08/20 0712) BP: (138)/(91) 138/91 (08/20 0712) SpO2:  [100 %] 100 % (08/20 0712) Weight:  [97.5 kg] 97.5 kg (08/20  0712) Constitutional:  Alert and oriented, No acute distress Cardiovascular: Regular rate  Respiratory: Normal respiratory effort GI: Abdomen is soft, nontender, nondistended, no abdominal masses. No CVAT.  Genitourinary: Normal male phallus, testes are descended bilaterally and non-tender and without masses, scrotum is normal in appearance without lesions or masses, perineum is normal on inspection. Lymphatic: No lymphadenopathy Neurologic: Grossly intact, no focal deficits Psychiatric: Normal mood and affect  Laboratory Data:  No results for input(s): WBC, HGB, HCT, PLT in the last 72 hours.  No results for input(s): NA, K, CL, GLUCOSE, BUN, CALCIUM, CREATININE in the last 72 hours.  Invalid input(s): CO3   No results found for this or any previous visit (from the past 24 hour(s)). Recent Results (from the past 240 hour(s))  SARS CORONAVIRUS 2 Nasal Swab Aptima Multi Swab     Status: None   Collection Time: 05/03/19  9:31 AM   Specimen: Aptima Multi Swab; Nasal Swab  Result Value Ref Range Status   SARS Coronavirus 2 NEGATIVE NEGATIVE Final    Comment: (NOTE) SARS-CoV-2 target nucleic acids are NOT DETECTED. The SARS-CoV-2 RNA is generally detectable in upper and lower respiratory specimens during the acute phase of infection. Negative results do not preclude SARS-CoV-2 infection, do not rule out co-infections with other pathogens, and should not be used as the sole basis for treatment or other patient management decisions.  Negative results must be combined with clinical observations, patient history, and epidemiological information. The expected result is Negative. Fact Sheet for Patients: SugarRoll.be Fact Sheet for Healthcare Providers: https://www.woods-mathews.com/ This test is not yet approved or cleared by the [place] FDA and  has been authorized for detection and/or diagnosis of SARS-CoV-2 by FDA under an Emergency Use  Authorization (EUA). This EUA will remain  in effect (meaning this test can be used) for the duration of the COVID-19 declaration under Section 56 4(b)(1) of the Act, 21 U.S.C. section 360bbb-3(b)(1), unless the authorization is terminated or revoked sooner. Performed at [facility], [place] [address] [place], [place] [address]     Renal Function: Recent Labs    05/03/19 7824  CREATININE 1.35*   Estimated Creatinine Clearance: 64.4 mL/min (A) (by C-G formula based on SCr of 1.35 mg/dL (H)).  Radiologic Imaging: No results found.  Impression/Assessment:  Adenocarcinoma the prostate, localized  Plan:  I-125 brachytherapy

## 2019-05-06 NOTE — Transfer of Care (Signed)
   Last Vitals:  Vitals Value Taken Time  BP 142/89 05/06/19 1105  Temp 36.8 C 05/06/19 1105  Pulse 70 05/06/19 1111  Resp 16 05/06/19 1111  SpO2 99 % 05/06/19 1111  Vitals shown include unvalidated device data.  Last Pain:  Vitals:   05/06/19 7939  TempSrc: Oral  PainSc: 0-No pain      Patients Stated Pain Goal: 3 (05/06/19 6886) Immediate Anesthesia Transfer of Care Note  Patient: Alan Knapp  Procedure(s) Performed: Procedure(s) (LRB): RADIOACTIVE SEED IMPLANT/BRACHYTHERAPY IMPLANT (N/A) SPACE OAR INSTILLATION (N/A)  Patient Location: PACU  Anesthesia Type: General  Level of Consciousness: awake, alert  and oriented  Airway & Oxygen Therapy: Patient Spontanous Breathing and Patient connected to nasal cannula oxygen  Post-op Assessment: Report given to PACU RN and Post -op Vital signs reviewed and stable  Post vital signs: Reviewed and stable  Complications: No apparent anesthesia complications

## 2019-05-07 ENCOUNTER — Encounter (HOSPITAL_BASED_OUTPATIENT_CLINIC_OR_DEPARTMENT_OTHER): Payer: Self-pay | Admitting: Urology

## 2019-05-09 NOTE — Progress Notes (Signed)
  Radiation Oncology         ([phone] ________________________________  Name: Alan Knapp MRN: [id number]  Date: 05/09/2019  DOB: Jan 05, 1951       Prostate Seed Implant  [id number], [name], MD  No ref. provider found  DIAGNOSIS: 68 y.o. gentleman with Stage T2a adenocarcinoma of the prostate with Gleason score of 3+4 and PSA of 4.05.    ICD-10-CM   1. Preop testing  Z01.818 DG Chest 2 View    DG Chest 2 View    PROCEDURE: Insertion of radioactive I-125 seeds into the prostate gland.  RADIATION DOSE: 145 Gy, definitive therapy.  TECHNIQUE: Alan Knapp was brought to the operating room with the urologist. He was placed in the dorsolithotomy position. He was catheterized and a rectal tube was inserted. The perineum was shaved, prepped and draped. The ultrasound probe was then introduced into the rectum to see the prostate gland.  TREATMENT DEVICE: A needle grid was attached to the ultrasound probe stand and anchor needles were placed.  3D PLANNING: The prostate was imaged in 3D using a sagittal sweep of the prostate probe. These images were transferred to the planning computer. There, the prostate, urethra and rectum were defined on each axial reconstructed image. Then, the software created an optimized 3D plan and a few seed positions were adjusted. The quality of the plan was reviewed using [facility] information for the target and the following two organs at risk:  Urethra and Rectum.  Then the accepted plan was printed and handed off to the radiation therapist.  Under my supervision, the custom loading of the seeds and spacers was carried out and loaded into sealed vicryl sleeves.  These pre-loaded needles were then placed into the needle holder.Marland Kitchen  PROSTATE VOLUME STUDY:  Using transrectal ultrasound the volume of the prostate was verified to be 35.5 cc.  SPECIAL TREATMENT PROCEDURE/SUPERVISION AND HANDLING: The pre-loaded needles were then delivered under sagittal guidance. A total of 28  needles were used to deposit 81 seeds in the prostate gland. The individual seed activity was 0.352 mCi.  SpaceOAR:  Yes  COMPLEX SIMULATION: At the end of the procedure, an anterior radiograph of the pelvis was obtained to document seed positioning and count. Cystoscopy was performed to check the urethra and bladder.  MICRODOSIMETRY: At the end of the procedure, the patient was emitting 0.127 mR/hr at 1 meter. Accordingly, he was considered safe for hospital discharge.  PLAN: The patient will return to the radiation oncology clinic for post implant CT dosimetry in three weeks.   ________________________________  Sheral Apley Tammi Klippel, M.D.

## 2019-05-10 ENCOUNTER — Other Ambulatory Visit: Payer: Self-pay

## 2019-05-10 DIAGNOSIS — Z20822 Contact with and (suspected) exposure to covid-19: Secondary | ICD-10-CM

## 2019-05-11 LAB — NOVEL CORONAVIRUS, NAA: SARS-CoV-2, NAA: NOT DETECTED

## 2019-05-20 ENCOUNTER — Other Ambulatory Visit: Payer: Self-pay | Admitting: Urology

## 2019-05-20 ENCOUNTER — Telehealth: Payer: Self-pay | Admitting: *Deleted

## 2019-05-20 MED ORDER — LORAZEPAM 1 MG PO TABS: 1.0000 mg | ORAL_TABLET | ORAL | 0 refills | Status: DC | PRN

## 2019-05-20 NOTE — Telephone Encounter (Signed)
Called patient's daughter- Amy to go over appts. for 05-27-19, lvm for a return call

## 2019-05-21 ENCOUNTER — Telehealth: Payer: Self-pay | Admitting: *Deleted

## 2019-05-21 NOTE — Telephone Encounter (Signed)
Called patient's daughter- Amy to inform that script is ready for pick-up at drugstore for MRI, spoke with patient's daughter - Amy and she verified understanding this

## 2019-05-25 NOTE — Progress Notes (Signed)
  Radiation Oncology         ([phone] ________________________________  Name: Alan Knapp MRN: [id number]  Date: 05/27/2019  DOB: December 03, 1950  COMPLEX SIMULATION NOTE  NARRATIVE:  The patient was brought to the [facility] today following prostate seed implantation approximately one month ago.  Identity was confirmed.  All relevant records and images related to the planned course of therapy were reviewed.  Then, the patient was set-up supine.  CT images were obtained.  The CT images were loaded into the planning software.  Then the prostate and rectum were contoured.  Treatment planning then occurred.  The implanted iodine 125 seeds were identified by the physics staff for projection of radiation distribution  I have requested : 3D Simulation  I have requested a DVH of the following structures: Prostate and rectum.    ________________________________  Sheral Apley Tammi Klippel, M.D.

## 2019-05-26 ENCOUNTER — Telehealth: Payer: Self-pay | Admitting: Radiation Oncology

## 2019-05-26 NOTE — Telephone Encounter (Signed)
Received voicemail message from patient's daughter, Amy, requesting to forego the MRI tomorrow due to copay cost. Phoned Amy back promptly. No answer. Left voicemail message explaining reason for MRI. Also, expressed this RN would have to get the OK from [employer], PA-C to cancel the MRI. Explained over the voicemail this RN would inquire and phone her back.

## 2019-05-26 NOTE — Telephone Encounter (Signed)
I recommend reiterating to them that this scan is quite important in allowing Dr. Tammi Klippel to assess the quality of the prostate seed implant. Our CT scan does not allow visualization of the SpaceOAR gel that was placed at the time of procedure and therefore the MRI is necessary to help identify what is prostate tissue and what is SpaceOAR gel in ensuring that the prostate has good coverage with the radioactive seeds. If they still refuse to go forward with the scan, please just document this in his chart and let [facility] know so that they are not waiting on the scan to proceed with post-seed assessment. -Shalla Bulluck

## 2019-05-27 ENCOUNTER — Other Ambulatory Visit: Payer: Self-pay

## 2019-05-27 ENCOUNTER — Ambulatory Visit
Admission: RE | Admit: 2019-05-27 | Discharge: 2019-05-27 | Disposition: A | Discharge status: Home / Self Care | Payer: Medicare Other | Source: Ambulatory Visit | Attending: Radiation Oncology | Admitting: Radiation Oncology

## 2019-05-27 ENCOUNTER — Telehealth: Payer: Self-pay | Admitting: Radiation Oncology

## 2019-05-27 ENCOUNTER — Ambulatory Visit (HOSPITAL_COMMUNITY)
Admission: RE | Admit: 2019-05-27 | Discharge: 2019-05-27 | Disposition: A | Discharge status: Home / Self Care | Payer: Medicare Other | Source: Ambulatory Visit | Attending: Urology | Admitting: Urology

## 2019-05-27 ENCOUNTER — Encounter: Payer: Self-pay | Admitting: Urology

## 2019-05-27 ENCOUNTER — Ambulatory Visit
Admission: RE | Admit: 2019-05-27 | Discharge: 2019-05-27 | Disposition: A | Discharge status: Home / Self Care | Payer: Medicare Other | Source: Ambulatory Visit | Attending: Urology | Admitting: Urology

## 2019-05-27 VITALS — BP 120/80 | HR 70 | Temp 98.0°F | Resp 16

## 2019-05-27 DIAGNOSIS — Z7982 Long term (current) use of aspirin: Secondary | ICD-10-CM | POA: Insufficient documentation

## 2019-05-27 DIAGNOSIS — C61 Malignant neoplasm of prostate: Secondary | ICD-10-CM | POA: Insufficient documentation

## 2019-05-27 DIAGNOSIS — Z79899 Other long term (current) drug therapy: Secondary | ICD-10-CM | POA: Diagnosis not present

## 2019-05-27 DIAGNOSIS — Z923 Personal history of irradiation: Secondary | ICD-10-CM | POA: Insufficient documentation

## 2019-05-27 NOTE — Telephone Encounter (Signed)
Received voicemail from patient's daughter, Amy, returning my call. Phoned her back promptly. She reports her father is unable to pay the $100 copay required for his MRI after just paying a $250 copay. As requested by Freeman Caldron, PA-C I reiterated to her that this scan is quite important in allowing Dr. Tammi Klippel to assess the quality of the prostate seed implant. Our CT scan does not allow visualization of the SpaceOAR gel that was placed at the time of procedure and therefore the MRI is necessary to help identify what is prostate tissue and what is SpaceOAR gel in ensuring that the prostate has good coverage with the radioactive seeds. She verbalized understanding.   While talking on the phone with Amy her father called. Amy put [place] all on a call together (3 way). Patient reported to this RN he went ahead with the MRI but "told them I don't have the money for the copay."   Patient and daughter understand this RN will reach out to Cira Rue, prostate navigator, to see if there is any financial assistance available to assist with this $100 copay. Appreciation verbalized by both.

## 2019-05-27 NOTE — Progress Notes (Signed)
Radiation Oncology         [phone] ________________________________  Name: Lowman Jenniges MRN: [id number]  Date: 05/27/2019  DOB: 1950/11/10  Post-Seed Follow-Up Visit Note  CC: Antony Contras, MD  Antony Contras, MD  Diagnosis:   68 y.o. gentleman with Stage T2a adenocarcinoma of the prostate with Gleason score of 3+4 and PSA of 4.05.    ICD-10-CM   1. Malignant neoplasm of prostate (HCC)  C61     Interval Since Last Radiation:  3 weeks 05/06/19:  Insertion of radioactive I-125 seeds into the prostate gland; 145 Gy, definitive/boost therapy with placement of SpaceOAR gel.  Narrative:  The patient returns today for routine follow-up.  He is complaining of increased urinary frequency and urinary hesitation symptoms. He filled out a questionnaire regarding urinary function today providing and overall IPSS score of 9 characterizing his symptoms as mild-moderate with increased frequency and urgency but improved on Flomax.  He specifically denies dysuria, gross hematuria, straining to void, incomplete bladder emptying or incontinence.  His pre-implant score was 0. He denies any abdominal pain or bowel symptoms.  He reports a healthy appetite and is maintaining his weight.  He denies any significant fatigue and is overall pleased with his progress to date.  ALLERGIES:  has No Known Allergies.  Meds: Current Outpatient Medications  Medication Sig Dispense Refill  . amLODipine (NORVASC) 10 MG tablet Take 10 mg by mouth daily.     Marland Kitchen aspirin 81 MG chewable tablet Chew 81 mg by mouth daily.    Marland Kitchen LORazepam (ATIVAN) 1 MG tablet Take 1 tablet (1 mg total) by mouth as needed for anxiety (take 30 minutes prior to MRI and may repeat once, just prior to scan, if needed). 2 tablet 0  . tamsulosin (FLOMAX) 0.4 MG CAPS capsule Take 0.4 mg by mouth daily after supper.     No current facility-administered medications for this visit.     Physical Findings: In general this is a well appearing African  American male in no acute distress. He's alert and oriented x4 and appropriate throughout the examination. Cardiopulmonary assessment is negative for acute distress and he exhibits normal effort.   Lab Findings: Lab Results  Component Value Date   WBC 7.8 05/03/2019   HGB 15.9 05/03/2019   HCT 47.3 05/03/2019   MCV 87.9 05/03/2019   PLT 217 05/03/2019    Radiographic Findings:  Patient underwent CT imaging in our clinic for post implant dosimetry. The CT will be reviewed by Dr. Tammi Klippel to confirm there is an adequate distribution of radioactive seeds throughout the prostate gland and ensure that there are no seeds in or near the rectum. His had his prostate MRI earlier today and those images will be fused with his CT images for further evaluation. We suspect the final radiation plan and dosimetry will show appropriate coverage of the prostate gland. He understands that we will call and inform him of any unexpected findings on further review of his imaging and dosimetry.  Impression/Plan: 68 y.o. gentleman with Stage T2a adenocarcinoma of the prostate with Gleason score of 3+4 and PSA of 4.05. The patient is recovering from the effects of radiation. His urinary symptoms should gradually improve over the next 4-6 months. We talked about this today. He is encouraged by his improvement already and is otherwise pleased with his outcome. We also talked about long-term follow-up for prostate cancer following seed implant. He understands that ongoing PSA determinations and digital rectal exams will help perform surveillance to  rule out disease recurrence. He has a follow up appointment scheduled with Dr. Diona Fanti in November 2020. He understands what to expect with his PSA measures. Patient was also educated today about some of the long-term effects from radiation including a small risk for rectal bleeding and possibly erectile dysfunction. We talked about some of the general management approaches to these  potential complications. However, I did encourage the patient to contact our office or return at any point if he has questions or concerns related to his previous radiation and prostate cancer.    Nicholos Johns, PA-C

## 2019-05-27 NOTE — Progress Notes (Signed)
Vitals stable. Patient denies pain. Patient continues to take Flomax as directed. Patient denies dysuria or hematuria. Patient denies urinary leakage or incontinence. Post seed IPSS 9 with patient pleased with urinary symptoms as they are. Patient had MRI this morning to confirm SpaceOar placement. Patient expressed need for financial assistance with copay. Reached out to Cira Rue, prostate navigator, for assistance. Patient request this RN contact his daughter, Amy, if assistance is available.

## 2019-06-02 ENCOUNTER — Encounter: Payer: Self-pay | Admitting: Radiation Oncology

## 2019-06-02 DIAGNOSIS — C61 Malignant neoplasm of prostate: Secondary | ICD-10-CM | POA: Diagnosis not present

## 2019-06-06 NOTE — Progress Notes (Signed)
  Radiation Oncology         [phone] ________________________________  Name: Warren Vivian MRN: [id number]  Date: 06/02/2019  DOB: 09/29/1950  3D Planning Note   Prostate Brachytherapy Post-Implant Dosimetry  Diagnosis: 68 y.o. gentleman with Stage T2a adenocarcinoma of the prostate with Gleason score of 3+4 and PSA of 4.05  Narrative: On a previous date, Jaelynn Hailey returned following prostate seed implantation for post implant planning. He underwent CT scan complex simulation to delineate the three-dimensional structures of the pelvis and demonstrate the radiation distribution.  Since that time, the seed localization, and complex isodose planning with dose volume histograms have now been completed.  Results:   Prostate Coverage - The dose of radiation delivered to the 90% or more of the prostate gland (D90) was 97.93% of the prescription dose. This exceeds our goal of greater than 90%. Rectal Sparing - The volume of rectal tissue receiving the prescription dose or higher was 0.0 cc. This falls under our thresholds tolerance of 1.0 cc.  Impression: The prostate seed implant appears to show adequate target coverage and appropriate rectal sparing.  Plan:  The patient will continue to follow with urology for ongoing PSA determinations. I would anticipate a high likelihood for local tumor control with minimal risk for rectal morbidity.  ________________________________  Sheral Apley Tammi Klippel, M.D.

## 2019-08-10 ENCOUNTER — Ambulatory Visit
Admission: RE | Admit: 2019-08-10 | Discharge: 2019-08-10 | Disposition: A | Discharge status: Home / Self Care | Payer: Medicare Other | Source: Ambulatory Visit | Attending: Family Medicine | Admitting: Family Medicine

## 2019-08-10 ENCOUNTER — Other Ambulatory Visit: Payer: Self-pay | Admitting: Family Medicine

## 2019-08-10 DIAGNOSIS — R059 Cough, unspecified: Secondary | ICD-10-CM

## 2019-08-10 DIAGNOSIS — R05 Cough: Secondary | ICD-10-CM

## 2019-11-11 ENCOUNTER — Ambulatory Visit: Payer: Medicare PPO | Attending: Internal Medicine

## 2019-11-11 DIAGNOSIS — Z23 Encounter for immunization: Secondary | ICD-10-CM | POA: Insufficient documentation

## 2019-11-11 NOTE — Progress Notes (Signed)
   Covid-19 Vaccination Clinic  Name:  Alan Knapp    MRN: [id number] DOB: [birth year]  11/11/2019  [name] was observed post Covid-19 immunization for 15 minutes without incidence. He was provided with Vaccine Information Sheet and instruction to access the V-Safe system.   [name] was instructed to call 911 with any severe reactions post vaccine: Marland Kitchen Difficulty breathing  . Swelling of your face and throat  . A fast heartbeat  . A bad rash all over your body  . Dizziness and weakness    Immunizations Administered    Name Date Dose VIS Date Route   Pfizer COVID-19 Vaccine 11/11/2019  8:23 AM 0.3 mL 08/27/2019 Intramuscular   Manufacturer: [place]   Lot: [id number]   [place]: [id number]

## 2019-12-07 ENCOUNTER — Ambulatory Visit: Payer: Medicare PPO | Attending: Internal Medicine

## 2019-12-07 DIAGNOSIS — Z23 Encounter for immunization: Secondary | ICD-10-CM

## 2019-12-07 NOTE — Progress Notes (Signed)
   Covid-19 Vaccination Clinic  Name:  Alan Knapp    MRN: [id number] DOB: March 03, 1951  12/07/2019  [name] was observed post Covid-19 immunization for 15 minutes without incident. He was provided with Vaccine Information Sheet and instruction to access the V-Safe system.   [name] was instructed to call 911 with any severe reactions post vaccine: Marland Kitchen Difficulty breathing  . Swelling of face and throat  . A fast heartbeat  . A bad rash all over body  . Dizziness and weakness   Immunizations Administered    Name Date Dose VIS Date Route   Pfizer COVID-19 Vaccine 12/07/2019  8:29 AM 0.3 mL 08/27/2019 Intramuscular   Manufacturer: [place]   Lot: [id number]   [place]: [id number]

## 2020-05-09 ENCOUNTER — Encounter: Payer: Self-pay | Admitting: Gastroenterology

## 2020-06-21 DIAGNOSIS — C61 Malignant neoplasm of prostate: Secondary | ICD-10-CM | POA: Diagnosis not present

## 2020-06-28 ENCOUNTER — Other Ambulatory Visit: Payer: Self-pay

## 2020-06-28 ENCOUNTER — Ambulatory Visit (AMBULATORY_SURGERY_CENTER): Payer: Self-pay | Admitting: *Deleted

## 2020-06-28 VITALS — Ht 71.25 in | Wt 210.0 lb

## 2020-06-28 DIAGNOSIS — Z86010 Personal history of colonic polyps: Secondary | ICD-10-CM

## 2020-06-28 MED ORDER — SUTAB 1479-225-188 MG PO TABS: 1.0000 | ORAL_TABLET | ORAL | 0 refills | Status: DC

## 2020-06-28 NOTE — Progress Notes (Signed)
Patient is here in-person for PV. Patient denies any allergies to eggs or soy. Patient denies any problems with anesthesia/sedation. Patient denies any oxygen use at home. Patient denies taking any diet/weight loss medications or blood thinners. Patient is not being treated for MRSA or C-diff. Patient is aware of our care-partner policy and [id number] safety protocol.   COVID-19 vaccines completed on 12/07/19, per patient.   sutab sample given to pt. Pt request different prep than last time.

## 2020-07-01 DIAGNOSIS — C61 Malignant neoplasm of prostate: Secondary | ICD-10-CM | POA: Diagnosis not present

## 2020-07-12 ENCOUNTER — Encounter: Payer: Self-pay | Admitting: Gastroenterology

## 2020-07-12 ENCOUNTER — Ambulatory Visit (AMBULATORY_SURGERY_CENTER): Payer: Medicare PPO | Admitting: Gastroenterology

## 2020-07-12 ENCOUNTER — Other Ambulatory Visit: Payer: Self-pay

## 2020-07-12 VITALS — BP 130/87 | HR 71 | Temp 97.1°F | Resp 21 | Ht 71.0 in | Wt 210.0 lb

## 2020-07-12 DIAGNOSIS — J449 Chronic obstructive pulmonary disease, unspecified: Secondary | ICD-10-CM | POA: Diagnosis not present

## 2020-07-12 DIAGNOSIS — E669 Obesity, unspecified: Secondary | ICD-10-CM | POA: Diagnosis not present

## 2020-07-12 DIAGNOSIS — D120 Benign neoplasm of cecum: Secondary | ICD-10-CM | POA: Diagnosis not present

## 2020-07-12 DIAGNOSIS — Z86010 Personal history of colonic polyps: Secondary | ICD-10-CM | POA: Diagnosis not present

## 2020-07-12 DIAGNOSIS — I10 Essential (primary) hypertension: Secondary | ICD-10-CM | POA: Diagnosis not present

## 2020-07-12 DIAGNOSIS — D123 Benign neoplasm of transverse colon: Secondary | ICD-10-CM

## 2020-07-12 DIAGNOSIS — D126 Benign neoplasm of colon, unspecified: Secondary | ICD-10-CM

## 2020-07-12 MED ORDER — SODIUM CHLORIDE 0.9 % IV SOLN: 500.0000 mL | Freq: Once | INTRAVENOUS | Status: DC

## 2020-07-12 NOTE — Patient Instructions (Signed)
.  Handout on polyps, diverticulosis given.    YOU HAD AN ENDOSCOPIC PROCEDURE TODAY AT THE [facility]:   Refer to the procedure report that was given to you for any specific questions about what was found during the examination.  If the procedure report does not answer your questions, please call your gastroenterologist to clarify.  If you requested that your care partner not be given the details of your procedure findings, then the procedure report has been included in a sealed envelope for you to review at your convenience later.  YOU SHOULD EXPECT: Some feelings of bloating in the abdomen. Passage of more gas than usual.  Walking can help get rid of the air that was put into your GI tract during the procedure and reduce the bloating. If you had a lower endoscopy (such as a colonoscopy or flexible sigmoidoscopy) you may notice spotting of blood in your stool or on the toilet paper. If you underwent a bowel prep for your procedure, you may not have a normal bowel movement for a few days.  Please Note:  You might notice some irritation and congestion in your nose or some drainage.  This is from the oxygen used during your procedure.  There is no need for concern and it should clear up in a day or so.  SYMPTOMS TO REPORT IMMEDIATELY:   Following lower endoscopy (colonoscopy or flexible sigmoidoscopy):  Excessive amounts of blood in the stool  Significant tenderness or worsening of abdominal pains  Swelling of the abdomen that is new, acute  Fever of 100F or higher   For urgent or emergent issues, a gastroenterologist can be reached at any hour by calling [phone]. Do not use MyChart messaging for urgent concerns.    DIET:  We do recommend a small meal at first, but then you may proceed to your regular diet.  Drink plenty of fluids but you should avoid alcoholic beverages for 24 hours.  ACTIVITY:  You should plan to take it easy for the rest of today and you should NOT  DRIVE or use heavy machinery until tomorrow (because of the sedation medicines used during the test).    FOLLOW UP: Our staff will call the number listed on your records 48-72 hours following your procedure to check on you and address any questions or concerns that you may have regarding the information given to you following your procedure. If we do not reach you, we will leave a message.  We will attempt to reach you two times.  During this call, we will ask if you have developed any symptoms of COVID 19. If you develop any symptoms (ie: fever, flu-like symptoms, shortness of breath, cough etc.) before then, please call [phone].  If you test positive for Covid 19 in the 2 weeks post procedure, please call and report this information to us.    If any biopsies were taken you will be contacted by phone or by letter within the next 1-3 weeks.  Please call us at [phone] if you have not heard about the biopsies in 3 weeks.    SIGNATURES/CONFIDENTIALITY: You and/or your care partner have signed paperwork which will be entered into your electronic medical record.  These signatures attest to the fact that that the information above on your After Visit Summary has been reviewed and is understood.  Full responsibility of the confidentiality of this discharge information lies with you and/or your care-partner. 

## 2020-07-12 NOTE — Progress Notes (Signed)
Pt's states no medical or surgical changes since previsit or office visit. VS by JD 

## 2020-07-12 NOTE — Progress Notes (Signed)
Report to PACU, RN, vss, BBS= Clear.  

## 2020-07-12 NOTE — Op Note (Signed)
[facility] Patient Name: Alan Knapp Procedure Date: 07/12/2020 11:36 AM MRN: [id number] Endoscopist: Remo Lipps P. Havery Moros , MD Age: 69 Referring MD:  Date of Birth: 11/11/1950 Gender: Male Account #: [id number] Procedure:                Colonoscopy Indications:              Surveillance: Personal history of adenomatous                            polyps on last colonoscopy 3 years ago (9 polyps                            2018) Medicines:                Monitored Anesthesia Care Procedure:                Pre-Anesthesia Assessment:                           - Prior to the procedure, a History and Physical                            was performed, and patient medications and                            allergies were reviewed. The patient's tolerance of                            previous anesthesia was also reviewed. The risks                            and benefits of the procedure and the sedation                            options and risks were discussed with the patient.                            All questions were answered, and informed consent                            was obtained. Prior Anticoagulants: The patient has                            taken no previous anticoagulant or antiplatelet                            agents. ASA Grade Assessment: II - A patient with                            mild systemic disease. After reviewing the risks                            and benefits, the patient was deemed in  satisfactory condition to undergo the procedure.                           After obtaining informed consent, the colonoscope                            was passed under direct vision. Throughout the                            procedure, the patient's blood pressure, pulse, and                            oxygen saturations were monitored continuously. The                            Colonoscope was introduced through the anus and                             advanced to the the cecum, identified by                            appendiceal orifice and ileocecal valve. The                            colonoscopy was performed without difficulty. The                            patient tolerated the procedure well. The quality                            of the bowel preparation was good. The ileocecal                            valve, appendiceal orifice, and rectum were                            photographed. Scope In: 11:48:53 AM Scope Out: 12:14:49 PM Scope Withdrawal Time: 0 hours 23 minutes 2 seconds  Total Procedure Duration: 0 hours 25 minutes 56 seconds  Findings:                 The perianal and digital rectal examinations were                            normal.                           A 3 to 4 mm polyp was found in the cecum. The polyp                            was sessile. The polyp was removed with a cold                            snare. Resection and retrieval were complete.  Four sessile polyps were found in the transverse                            colon. The polyps were 3 to 4 mm in size. These                            polyps were removed with a cold snare. Resection                            and retrieval were complete.                           A 3 mm polyp was found in the splenic flexure. The                            polyp was sessile. The polyp was removed with a                            cold snare. Resection and retrieval were complete.                           Multiple medium-mouthed diverticula were found in                            the sigmoid colon.                           The exam was otherwise without abnormality. Complications:            No immediate complications. Estimated blood loss:                            Minimal. Estimated Blood Loss:     Estimated blood loss was minimal. Impression:               - One 3 to 4 mm polyp in the cecum, removed with a                             cold snare. Resected and retrieved.                           - Four 3 to 4 mm polyps in the transverse colon,                            removed with a cold snare. Resected and retrieved.                           - One 3 mm polyp at the splenic flexure, removed                            with a cold snare. Resected and retrieved.                           -  Diverticulosis in the sigmoid colon.                           - The examination was otherwise normal. Recommendation:           - Patient has a contact number available for                            emergencies. The signs and symptoms of potential                            delayed complications were discussed with the                            patient. Return to normal activities tomorrow.                            Written discharge instructions were provided to the                            patient.                           - Resume previous diet.                           - Continue present medications.                           - Await pathology results. Remo Lipps P. Danasha Melman, MD 07/12/2020 12:20:28 PM This report has been signed electronically.

## 2020-07-12 NOTE — Progress Notes (Signed)
Called to room to assist during endoscopic procedure.  Patient ID and intended procedure confirmed with present staff. Received instructions for my participation in the procedure from the performing physician.  

## 2020-07-14 ENCOUNTER — Telehealth: Payer: Self-pay | Admitting: *Deleted

## 2020-07-14 NOTE — Telephone Encounter (Signed)
  Follow up Call-  Call back number 07/12/2020  Post procedure Call Back phone  # [phone]  Permission to leave phone message Yes  Some recent data might be hidden     Patient questions:  Do you have a fever, pain , or abdominal swelling? No. Pain Score  0 *  Have you tolerated food without any problems? Yes.    Have you been able to return to your normal activities? Yes.    Do you have any questions about your discharge instructions: Diet   No. Medications  No. Follow up visit  No.  Do you have questions or concerns about your Care? No.  Actions: * If pain score is 4 or above: No action needed, pain <4  1. Have you developed a fever since your procedure? NO  2.   Have you had an respiratory symptoms (SOB or cough) since your procedure? NO  3.   Have you tested positive for COVID 19 since your procedure NO  4.   Have you had any family members/close contacts diagnosed with the COVID 19 since your procedure?  NO   If yes to any of these questions please route to Joylene John, RN and Joella Prince, RN

## 2020-07-18 ENCOUNTER — Encounter: Payer: Self-pay | Admitting: Gastroenterology

## 2020-07-25 IMAGING — CR DG CHEST 2V
2 series · 2 of 2 positions shown · non-contrast
Comparison: March 18, 2019.

CLINICAL DATA: Persistent cough.

EXAM:
CHEST - 2 VIEW

[w chest pa]
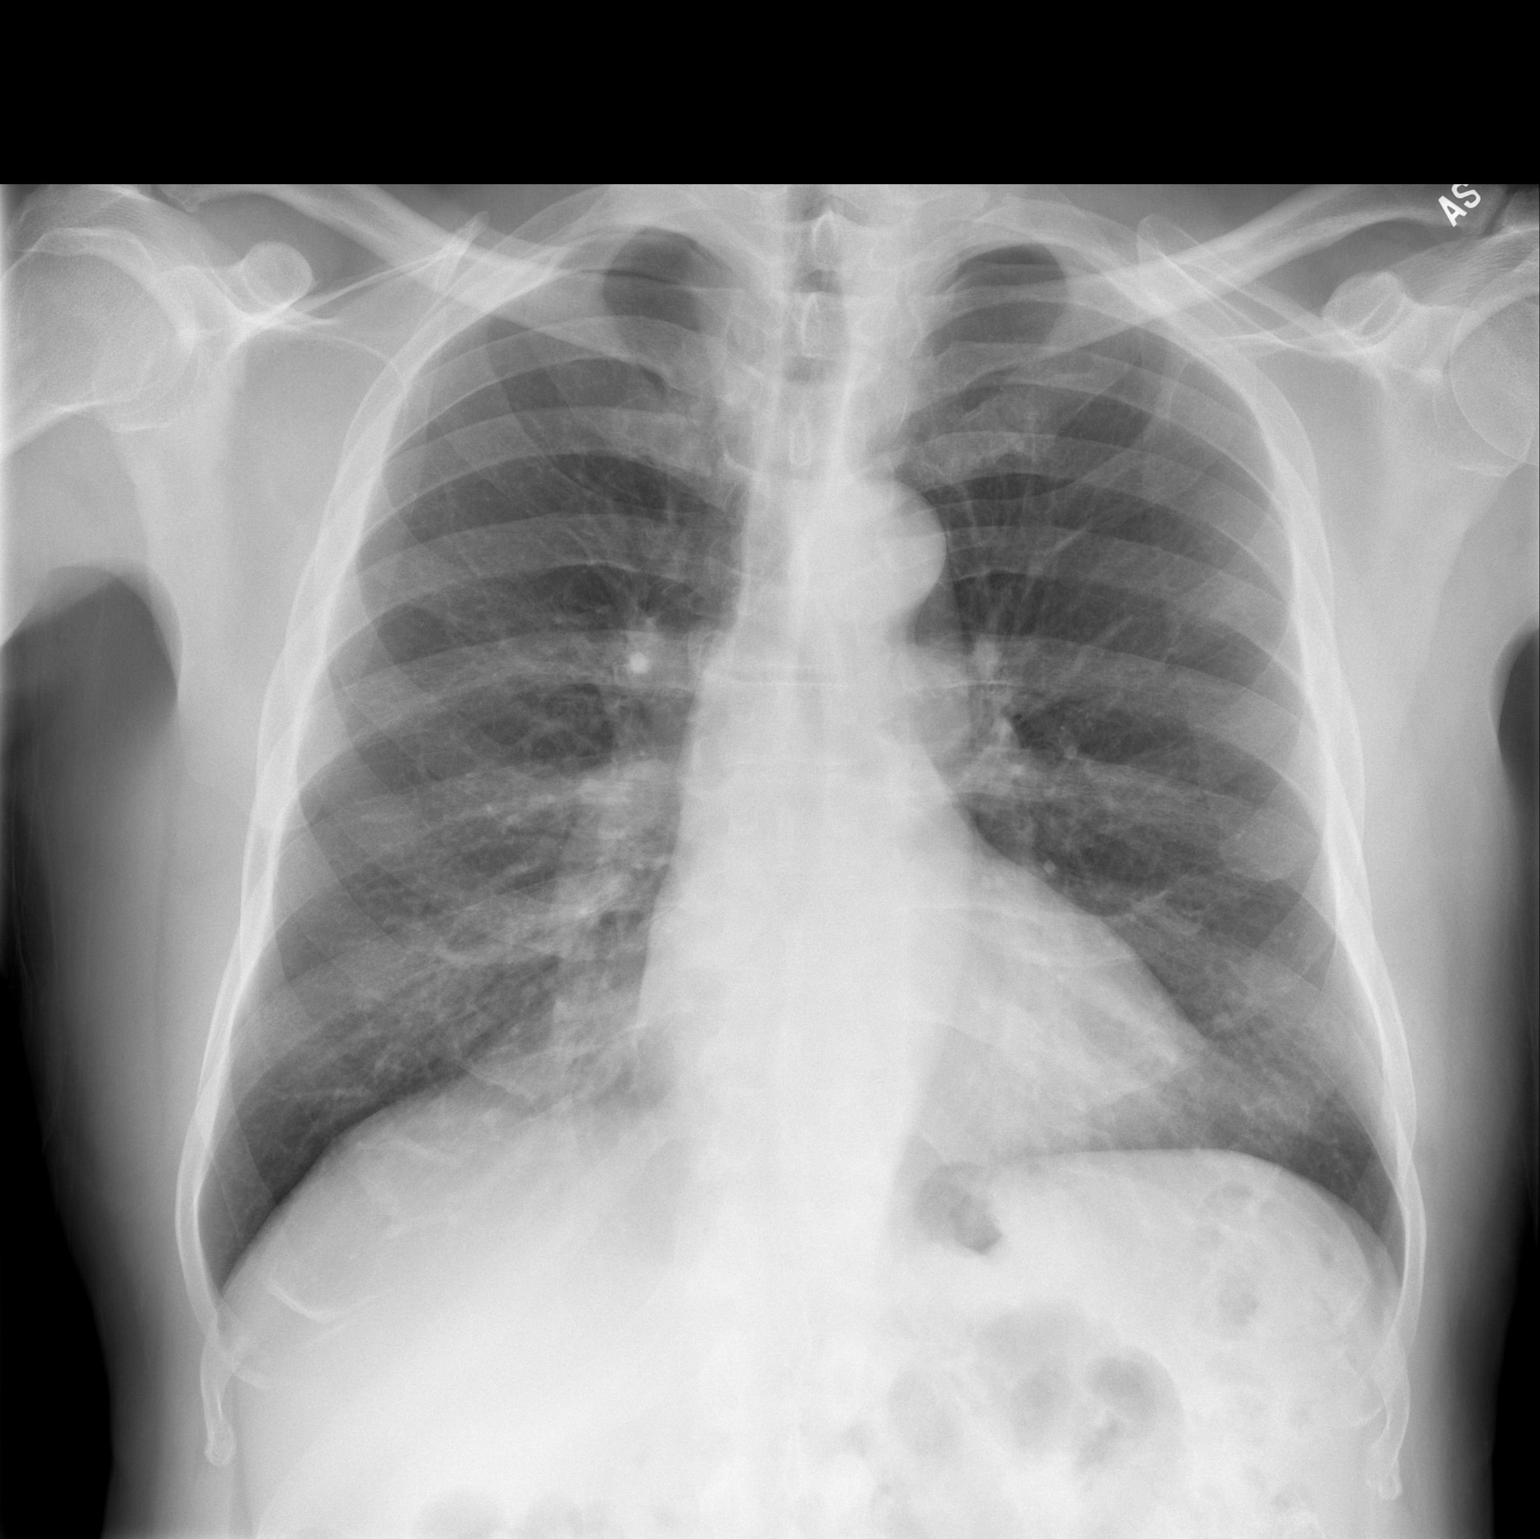

[w chest lat *]
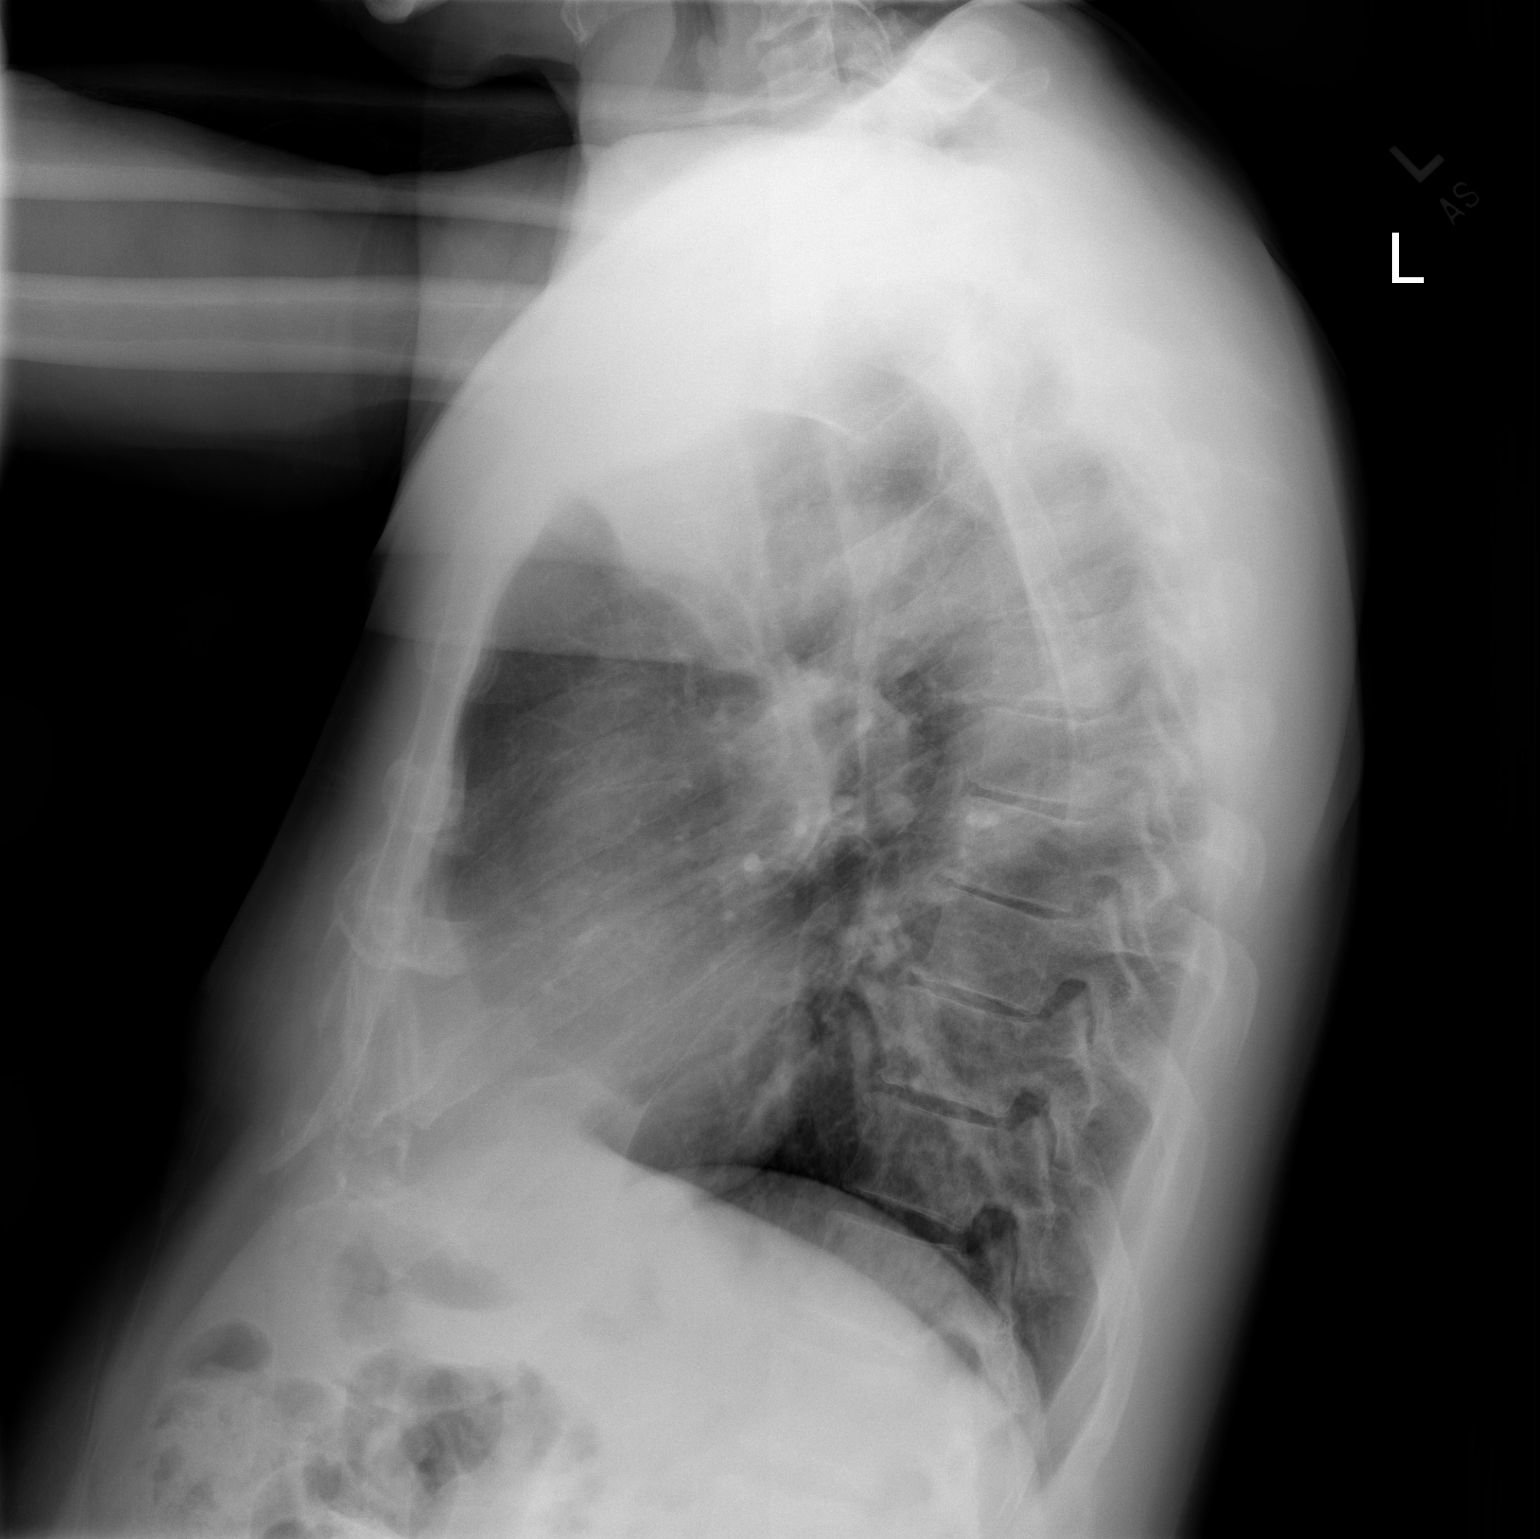

[2 of 2 positions shown; findings below may reference images not displayed]

FINDINGS: The heart size and mediastinal contours are within normal limits.
Both lungs are clear. The visualized skeletal structures are
unremarkable.
IMPRESSION: No active cardiopulmonary disease.

## 2020-07-29 ENCOUNTER — Ambulatory Visit: Payer: Medicare PPO | Attending: Internal Medicine

## 2020-07-29 DIAGNOSIS — Z23 Encounter for immunization: Secondary | ICD-10-CM

## 2020-07-29 NOTE — Progress Notes (Signed)
° °  Covid-19 Vaccination Clinic  Name:  Alan Knapp    MRN: [id number] DOB: 03-Jan-1951  07/29/2020  Mr. Alan Knapp was observed post Covid-19 immunization for 15 minutes without incident. He was provided with Vaccine Information Sheet and instruction to access the V-Safe system.   Mr. Alan Knapp was instructed to call 911 with any severe reactions post vaccine:  Difficulty breathing   Swelling of face and throat   A fast heartbeat   A bad rash all over body   Dizziness and weakness

## 2020-08-14 DIAGNOSIS — C61 Malignant neoplasm of prostate: Secondary | ICD-10-CM | POA: Diagnosis not present

## 2020-08-14 DIAGNOSIS — N5201 Erectile dysfunction due to arterial insufficiency: Secondary | ICD-10-CM | POA: Diagnosis not present

## 2021-01-25 DIAGNOSIS — R7303 Prediabetes: Secondary | ICD-10-CM | POA: Diagnosis not present

## 2021-01-25 DIAGNOSIS — L309 Dermatitis, unspecified: Secondary | ICD-10-CM | POA: Diagnosis not present

## 2021-01-25 DIAGNOSIS — Z1389 Encounter for screening for other disorder: Secondary | ICD-10-CM | POA: Diagnosis not present

## 2021-01-25 DIAGNOSIS — C61 Malignant neoplasm of prostate: Secondary | ICD-10-CM | POA: Diagnosis not present

## 2021-01-25 DIAGNOSIS — Z720 Tobacco use: Secondary | ICD-10-CM | POA: Diagnosis not present

## 2021-01-25 DIAGNOSIS — Z0000 Encounter for general adult medical examination without abnormal findings: Secondary | ICD-10-CM | POA: Diagnosis not present

## 2021-01-25 DIAGNOSIS — N529 Male erectile dysfunction, unspecified: Secondary | ICD-10-CM | POA: Diagnosis not present

## 2021-01-25 DIAGNOSIS — I10 Essential (primary) hypertension: Secondary | ICD-10-CM | POA: Diagnosis not present

## 2021-01-25 DIAGNOSIS — J309 Allergic rhinitis, unspecified: Secondary | ICD-10-CM | POA: Diagnosis not present

## 2021-02-14 DIAGNOSIS — N5201 Erectile dysfunction due to arterial insufficiency: Secondary | ICD-10-CM | POA: Diagnosis not present

## 2021-02-14 DIAGNOSIS — C61 Malignant neoplasm of prostate: Secondary | ICD-10-CM | POA: Diagnosis not present

## 2021-03-13 DIAGNOSIS — Z03818 Encounter for observation for suspected exposure to other biological agents ruled out: Secondary | ICD-10-CM | POA: Diagnosis not present

## 2021-03-13 DIAGNOSIS — Z20822 Contact with and (suspected) exposure to covid-19: Secondary | ICD-10-CM | POA: Diagnosis not present

## 2021-08-15 DIAGNOSIS — C61 Malignant neoplasm of prostate: Secondary | ICD-10-CM | POA: Diagnosis not present

## 2021-08-15 DIAGNOSIS — N5201 Erectile dysfunction due to arterial insufficiency: Secondary | ICD-10-CM | POA: Diagnosis not present

## 2021-08-15 DIAGNOSIS — Z8546 Personal history of malignant neoplasm of prostate: Secondary | ICD-10-CM | POA: Diagnosis not present

## 2022-02-25 DIAGNOSIS — R7303 Prediabetes: Secondary | ICD-10-CM | POA: Diagnosis not present

## 2022-02-25 DIAGNOSIS — C61 Malignant neoplasm of prostate: Secondary | ICD-10-CM | POA: Diagnosis not present

## 2022-02-25 DIAGNOSIS — J309 Allergic rhinitis, unspecified: Secondary | ICD-10-CM | POA: Diagnosis not present

## 2022-02-25 DIAGNOSIS — L309 Dermatitis, unspecified: Secondary | ICD-10-CM | POA: Diagnosis not present

## 2022-02-25 DIAGNOSIS — I10 Essential (primary) hypertension: Secondary | ICD-10-CM | POA: Diagnosis not present

## 2022-02-25 DIAGNOSIS — Z1331 Encounter for screening for depression: Secondary | ICD-10-CM | POA: Diagnosis not present

## 2022-02-25 DIAGNOSIS — N529 Male erectile dysfunction, unspecified: Secondary | ICD-10-CM | POA: Diagnosis not present

## 2022-02-25 DIAGNOSIS — Z0000 Encounter for general adult medical examination without abnormal findings: Secondary | ICD-10-CM | POA: Diagnosis not present

## 2022-02-25 DIAGNOSIS — Z720 Tobacco use: Secondary | ICD-10-CM | POA: Diagnosis not present

## 2022-07-25 DIAGNOSIS — H40033 Anatomical narrow angle, bilateral: Secondary | ICD-10-CM | POA: Diagnosis not present

## 2022-07-25 DIAGNOSIS — H2513 Age-related nuclear cataract, bilateral: Secondary | ICD-10-CM | POA: Diagnosis not present

## 2022-10-29 DIAGNOSIS — H524 Presbyopia: Secondary | ICD-10-CM | POA: Diagnosis not present

## 2023-03-11 ENCOUNTER — Other Ambulatory Visit (HOSPITAL_COMMUNITY): Payer: Self-pay

## 2023-04-07 DIAGNOSIS — L309 Dermatitis, unspecified: Secondary | ICD-10-CM | POA: Diagnosis not present

## 2023-04-07 DIAGNOSIS — Z0000 Encounter for general adult medical examination without abnormal findings: Secondary | ICD-10-CM | POA: Diagnosis not present

## 2023-04-07 DIAGNOSIS — R7303 Prediabetes: Secondary | ICD-10-CM | POA: Diagnosis not present

## 2023-04-07 DIAGNOSIS — N529 Male erectile dysfunction, unspecified: Secondary | ICD-10-CM | POA: Diagnosis not present

## 2023-04-07 DIAGNOSIS — Z23 Encounter for immunization: Secondary | ICD-10-CM | POA: Diagnosis not present

## 2023-04-07 DIAGNOSIS — I10 Essential (primary) hypertension: Secondary | ICD-10-CM | POA: Diagnosis not present

## 2023-04-07 DIAGNOSIS — C61 Malignant neoplasm of prostate: Secondary | ICD-10-CM | POA: Diagnosis not present

## 2023-04-07 DIAGNOSIS — Z1331 Encounter for screening for depression: Secondary | ICD-10-CM | POA: Diagnosis not present

## 2023-04-07 DIAGNOSIS — J309 Allergic rhinitis, unspecified: Secondary | ICD-10-CM | POA: Diagnosis not present

## 2023-04-07 DIAGNOSIS — R059 Cough, unspecified: Secondary | ICD-10-CM | POA: Diagnosis not present

## 2023-04-08 ENCOUNTER — Other Ambulatory Visit: Payer: Self-pay | Admitting: Family Medicine

## 2023-04-08 ENCOUNTER — Ambulatory Visit
Admission: RE | Admit: 2023-04-08 | Discharge: 2023-04-08 | Disposition: A | Discharge status: Home / Self Care | Payer: Medicare HMO | Source: Ambulatory Visit | Attending: Family Medicine | Admitting: Family Medicine

## 2023-04-08 DIAGNOSIS — R059 Cough, unspecified: Secondary | ICD-10-CM | POA: Diagnosis not present

## 2023-04-08 DIAGNOSIS — R0989 Other specified symptoms and signs involving the circulatory and respiratory systems: Secondary | ICD-10-CM | POA: Diagnosis not present

## 2023-07-30 ENCOUNTER — Encounter: Payer: Self-pay | Admitting: Gastroenterology

## 2024-05-06 DIAGNOSIS — J309 Allergic rhinitis, unspecified: Secondary | ICD-10-CM | POA: Diagnosis not present

## 2024-05-06 DIAGNOSIS — Z0000 Encounter for general adult medical examination without abnormal findings: Secondary | ICD-10-CM | POA: Diagnosis not present

## 2024-05-06 DIAGNOSIS — Z1331 Encounter for screening for depression: Secondary | ICD-10-CM | POA: Diagnosis not present

## 2024-05-06 DIAGNOSIS — Z720 Tobacco use: Secondary | ICD-10-CM | POA: Diagnosis not present

## 2024-05-06 DIAGNOSIS — R7303 Prediabetes: Secondary | ICD-10-CM | POA: Diagnosis not present

## 2024-05-06 DIAGNOSIS — N529 Male erectile dysfunction, unspecified: Secondary | ICD-10-CM | POA: Diagnosis not present

## 2024-05-06 DIAGNOSIS — I10 Essential (primary) hypertension: Secondary | ICD-10-CM | POA: Diagnosis not present

## 2024-05-06 DIAGNOSIS — C61 Malignant neoplasm of prostate: Secondary | ICD-10-CM | POA: Diagnosis not present

## 2024-05-06 DIAGNOSIS — Z08 Encounter for follow-up examination after completed treatment for malignant neoplasm: Secondary | ICD-10-CM | POA: Diagnosis not present

## 2024-05-10 ENCOUNTER — Telehealth: Payer: Self-pay | Admitting: Acute Care

## 2024-05-10 DIAGNOSIS — Z122 Encounter for screening for malignant neoplasm of respiratory organs: Secondary | ICD-10-CM

## 2024-05-10 DIAGNOSIS — Z87891 Personal history of nicotine dependence: Secondary | ICD-10-CM

## 2024-05-10 DIAGNOSIS — F17210 Nicotine dependence, cigarettes, uncomplicated: Secondary | ICD-10-CM

## 2024-05-10 NOTE — Telephone Encounter (Signed)
 Lung Cancer Screening Narrative/Criteria Questionnaire (Cigarette Smokers Only- No Cigars/Pipes/vapes)   Alan Knapp   SDMV:05/19/2024 9:30 [place]      [birth year]   LDCT: 05/20/2024 8:00 GI    73 y.o.   Phone: [phone], daughters number but will get patient for call  Lung Screening Narrative (confirm age 72-77 yrs Medicare / 50-80 yrs Private pay insurance)   Insurance information:[employer]   Referring Provider:Dr. Seabron   This screening involves an initial phone call with a team member from our program. It is called a shared decision making visit. The initial meeting is required by  insurance and Medicare to make sure you understand the program. This appointment takes about 15-20 minutes to complete. You will complete the screening scan at your scheduled date/time.  This scan takes about 5-10 minutes to complete. You can eat and drink normally before and after the scan.  Criteria questions for Lung Cancer Screening:   Are you a current or former smoker? Current Age began smoking: 73yo   If you are a former smoker, what year did you quit smoking? N/A(within 15 yrs)   To calculate your smoking history, I need an accurate estimate of how many packs of cigarettes you smoked per day and for how many years. (Not just the number of PPD you are now smoking)   Years smoking 57 x Packs per day 1 = Pack years 57   (at least 20 pack yrs)   (Make sure they understand that we need to know how much they have smoked in the past, not just the number of PPD they are smoking now)  Do you have a personal history of cancer?  Yes - (type and when diagnosed - 5 yrs cancer free) prostate - dx in 2019, had radioactive beads    Do you have a family history of cancer? Yes  (cancer type and and relative) father - unsure, Brother - pancreatic, son - prostate  Are you coughing up blood?  No  Have you had unexplained weight loss of 15 lbs or more in the last 6 months? No  It looks like you meet all criteria.   When would be a good time for us  to schedule you for this screening?   Additional information: N/A

## 2024-05-14 ENCOUNTER — Encounter: Payer: Self-pay | Admitting: Acute Care

## 2024-05-19 ENCOUNTER — Encounter: Payer: Self-pay | Admitting: Adult Health

## 2024-05-19 ENCOUNTER — Ambulatory Visit (INDEPENDENT_AMBULATORY_CARE_PROVIDER_SITE_OTHER): Admitting: Adult Health

## 2024-05-19 DIAGNOSIS — F17210 Nicotine dependence, cigarettes, uncomplicated: Secondary | ICD-10-CM

## 2024-05-19 NOTE — Patient Instructions (Addendum)

## 2024-05-19 NOTE — Progress Notes (Signed)
  Virtual Visit via Telephone Note  I connected with Brees Hounshell , 05/19/24 9:38 AM by a telemedicine application and verified that I am speaking with the correct person using two identifiers.  Location: Patient: home Provider: home   I discussed the limitations of evaluation and management by telemedicine and the availability of in person appointments. The patient expressed understanding and agreed to proceed.   Shared Decision Making Visit Lung Cancer Screening Program [phone])   Eligibility: 73 y.o. Pack Years Smoking History Calculation = 57 pack years  (# packs/per year x # years smoked) Recent History of coughing up blood  no Unexplained weight loss? no ( >Than 15 pounds within the last 6 months ) Prior History Lung / other cancer no (Diagnosis within the last 5 years already requiring surveillance chest CT Scans). Smoking Status Current Smoker   Visit Components: Discussion included one or more decision making aids. YES Discussion included risk/benefits of screening. YES Discussion included potential follow up diagnostic testing for abnormal scans. YES Discussion included meaning and risk of over diagnosis. YES Discussion included meaning and risk of False Positives. YES Discussion included meaning of total radiation exposure. YES  Counseling Included: Importance of adherence to annual lung cancer LDCT screening. YES Impact of comorbidities on ability to participate in the program. YES Ability and willingness to under diagnostic treatment. YES  Smoking Cessation Counseling: Current Smokers:  Discussed importance of smoking cessation. yes Information about tobacco cessation classes and interventions provided to patient. yes Patient provided with ticket for LDCT Scan. yes Symptomatic Patient. NO Diagnosis Code: Tobacco Use Z72.0 Asymptomatic Patient yes  Counseling - 4 minutes of smoking cessation counseling (CT Chest Lung Cancer Screening Low Dose W/O CM)  [id number]  Z12.2-Screening of respiratory organs Z87.891-Personal history of nicotine dependence   Lamarr Myers 05/19/24

## 2024-05-20 ENCOUNTER — Ambulatory Visit
Admission: RE | Admit: 2024-05-20 | Discharge: 2024-05-20 | Disposition: A | Discharge status: Home / Self Care | Source: Ambulatory Visit | Attending: Acute Care | Admitting: Acute Care

## 2024-05-20 DIAGNOSIS — F17210 Nicotine dependence, cigarettes, uncomplicated: Secondary | ICD-10-CM | POA: Diagnosis not present

## 2024-05-20 DIAGNOSIS — Z87891 Personal history of nicotine dependence: Secondary | ICD-10-CM

## 2024-05-20 DIAGNOSIS — Z122 Encounter for screening for malignant neoplasm of respiratory organs: Secondary | ICD-10-CM | POA: Diagnosis not present

## 2024-06-02 ENCOUNTER — Other Ambulatory Visit: Payer: Self-pay | Admitting: Acute Care

## 2024-06-02 DIAGNOSIS — Z122 Encounter for screening for malignant neoplasm of respiratory organs: Secondary | ICD-10-CM

## 2024-06-02 DIAGNOSIS — Z87891 Personal history of nicotine dependence: Secondary | ICD-10-CM

## 2024-06-02 DIAGNOSIS — F17210 Nicotine dependence, cigarettes, uncomplicated: Secondary | ICD-10-CM
